# Patient Record
Sex: Female | Born: 1960
Health system: Southern US, Community
[De-identification: ages and names within clinical notes are randomized; demographics above are authoritative.]

## PROBLEM LIST (undated history)

## (undated) DIAGNOSIS — M1990 Unspecified osteoarthritis, unspecified site: Secondary | ICD-10-CM

## (undated) HISTORY — PX: CHOLECYSTECTOMY: SHX55

## (undated) HISTORY — DX: Unspecified osteoarthritis, unspecified site: M19.90

## (undated) HISTORY — PX: OTHER SURGICAL HISTORY: SHX169

## (undated) HISTORY — PX: WISDOM TOOTH EXTRACTION: SHX21

---

## 2012-04-28 ENCOUNTER — Other Ambulatory Visit: Payer: Self-pay | Admitting: Obstetrics and Gynecology

## 2012-04-28 DIAGNOSIS — Z1231 Encounter for screening mammogram for malignant neoplasm of breast: Secondary | ICD-10-CM

## 2012-05-12 ENCOUNTER — Ambulatory Visit
Admission: RE | Admit: 2012-05-12 | Discharge: 2012-05-12 | Disposition: A | Discharge status: Home / Self Care | Payer: 59 | Source: Ambulatory Visit | Attending: Obstetrics and Gynecology | Admitting: Obstetrics and Gynecology

## 2012-05-12 DIAGNOSIS — Z1231 Encounter for screening mammogram for malignant neoplasm of breast: Secondary | ICD-10-CM

## 2012-05-17 ENCOUNTER — Other Ambulatory Visit: Payer: Self-pay | Admitting: Obstetrics and Gynecology

## 2012-05-17 DIAGNOSIS — R928 Other abnormal and inconclusive findings on diagnostic imaging of breast: Secondary | ICD-10-CM

## 2012-05-19 ENCOUNTER — Ambulatory Visit
Admission: RE | Admit: 2012-05-19 | Discharge: 2012-05-19 | Disposition: A | Discharge status: Home / Self Care | Payer: 59 | Source: Ambulatory Visit | Attending: Obstetrics and Gynecology | Admitting: Obstetrics and Gynecology

## 2012-05-19 DIAGNOSIS — R928 Other abnormal and inconclusive findings on diagnostic imaging of breast: Secondary | ICD-10-CM

## 2014-02-01 ENCOUNTER — Other Ambulatory Visit: Payer: Self-pay

## 2014-02-01 DIAGNOSIS — Z1231 Encounter for screening mammogram for malignant neoplasm of breast: Secondary | ICD-10-CM

## 2014-03-08 ENCOUNTER — Encounter (INDEPENDENT_AMBULATORY_CARE_PROVIDER_SITE_OTHER): Payer: Self-pay

## 2014-03-08 ENCOUNTER — Ambulatory Visit
Admission: RE | Admit: 2014-03-08 | Discharge: 2014-03-08 | Disposition: A | Discharge status: Home / Self Care | Payer: BC Managed Care – PPO | Source: Ambulatory Visit

## 2014-03-08 DIAGNOSIS — Z1231 Encounter for screening mammogram for malignant neoplasm of breast: Secondary | ICD-10-CM

## 2014-03-11 ENCOUNTER — Other Ambulatory Visit: Payer: Self-pay | Admitting: Obstetrics and Gynecology

## 2014-03-11 DIAGNOSIS — R928 Other abnormal and inconclusive findings on diagnostic imaging of breast: Secondary | ICD-10-CM

## 2014-03-19 ENCOUNTER — Ambulatory Visit
Admission: RE | Admit: 2014-03-19 | Discharge: 2014-03-19 | Disposition: A | Discharge status: Home / Self Care | Payer: BC Managed Care – PPO | Source: Ambulatory Visit | Attending: Obstetrics and Gynecology | Admitting: Obstetrics and Gynecology

## 2014-03-19 DIAGNOSIS — R928 Other abnormal and inconclusive findings on diagnostic imaging of breast: Secondary | ICD-10-CM

## 2014-04-01 ENCOUNTER — Ambulatory Visit (INDEPENDENT_AMBULATORY_CARE_PROVIDER_SITE_OTHER): Payer: BC Managed Care – PPO | Admitting: Sports Medicine

## 2014-04-01 ENCOUNTER — Encounter: Payer: Self-pay | Admitting: Sports Medicine

## 2014-04-01 ENCOUNTER — Ambulatory Visit
Admission: RE | Admit: 2014-04-01 | Discharge: 2014-04-01 | Disposition: A | Discharge status: Home / Self Care | Payer: BC Managed Care – PPO | Source: Ambulatory Visit | Attending: Sports Medicine | Admitting: Sports Medicine

## 2014-04-01 VITALS — BP 108/80 | Ht 64.0 in | Wt 165.0 lb

## 2014-04-01 DIAGNOSIS — M1612 Unilateral primary osteoarthritis, left hip: Secondary | ICD-10-CM

## 2014-04-01 DIAGNOSIS — M25559 Pain in unspecified hip: Secondary | ICD-10-CM

## 2014-04-01 DIAGNOSIS — M25552 Pain in left hip: Secondary | ICD-10-CM

## 2014-04-01 DIAGNOSIS — M1610 Unilateral primary osteoarthritis, unspecified hip: Secondary | ICD-10-CM

## 2014-04-01 NOTE — Progress Notes (Signed)
   Subjective:    Patient ID: Helen Andrade, female    DOB: [birth year], 53 y.o.Helen Andrade   MRN: [id number]  HPI chief complaint: Left hip pain  Very pleasant 53 year old female comes in today at the request of Helen PeckLorraine Andrade for evaluation of her left hip. She has had left hip pain now for about 3 years. Pain began acutely after doing some stretches. Her pain is primarily in the left groin but at times were radiate through to the posterior hip. Her pain is not terribly uncomfortable but she does notice limited range of motion from time to time. She notices it most when she is externally rotating her hip or going to step on a very high step. She's had physical therapy both in [place] well as [place]. Dry needling seems to help her quite a bit. She is also being given several hip stretches and admits that today she feels probably as good as she has felt in quite some time. She has not had any imaging studies of her hip. No remote trauma. She does not take any pain medication for this. No associated numbness or tingling. She is leaving in 3 weeks for a trip to [place] and will be back in the middle of September.    Review of Systems    as above Objective:   Physical Exam Well-developed, well-nourished. No acute distress. Awake alert and oriented x3. Vital signs are reviewed  Left hip: Patient has smooth painless hip range of motion but has reproducible groin pain with full internal rotation and axial loading of the joint. No pain with resisted hip flexion. Good strength. No tenderness over the greater trochanteric bursa. Positive Thomas test. Slight leg length discrepancy with the right leg being about 1 cm shorter than the left. Neurovascularly intact distally. Walking without a limp.  X-rays of the left hip including an AP pelvis and lateral left hip are reviewed. There are mild degenerative changes in the left acetabulum. Otherwise unremarkable.       Assessment & Plan:    Left hip pain secondary to mild hip osteoarthritis  Patient's symptoms are definitely tolerable. I think she should continue to work on range of motion and stretching. Her symptoms are not significant enough to consider anything more aggressive at this time. If that changes we could consider an intra-articular injection. Followup prn.  Addendum: Patient left the office the day before I could fit her with a 3/16 inch heel lift for her shorter right leg. She may stop by the office at her convenience to pick this up.

## 2014-05-15 ENCOUNTER — Encounter: Payer: Self-pay | Admitting: Sports Medicine

## 2014-05-15 ENCOUNTER — Ambulatory Visit (INDEPENDENT_AMBULATORY_CARE_PROVIDER_SITE_OTHER): Payer: BC Managed Care – PPO | Admitting: Sports Medicine

## 2014-05-15 VITALS — BP 89/52 | Ht 64.0 in | Wt 165.0 lb

## 2014-05-15 DIAGNOSIS — M25552 Pain in left hip: Secondary | ICD-10-CM

## 2014-05-15 DIAGNOSIS — M1612 Unilateral primary osteoarthritis, left hip: Secondary | ICD-10-CM

## 2014-05-15 DIAGNOSIS — M1610 Unilateral primary osteoarthritis, unspecified hip: Secondary | ICD-10-CM

## 2014-05-15 DIAGNOSIS — M25559 Pain in unspecified hip: Secondary | ICD-10-CM

## 2014-05-15 NOTE — Progress Notes (Signed)
   Subjective:    Patient ID: Helen Andrade, female    DOB: Oct 18, 1960, 53 y.o.   MRN: [id number]  HPI Patient comes in today for followup on left hip pain. She still struggling with diffuse pain in both the anterior and posterior hip. She has radiating pain down into her femur first thing in the morning. Recent x-rays showed some mild degenerative changes in this left hip. Overall, her symptoms are very tolerable. She is able to remain active but does state that the pain becomes quite annoying.    Review of Systems     Objective:   Physical Exam Well-developed, no acute distress.  Left hip: Patient demonstrates good full hip range of motion but does have reproducible pain with full internal rotation. Reproducible pain with resisted hip flexion as well. Neurovascularly intact distally. Walking without a significant limp.  X-rays are as above.       Assessment & Plan:  Left hip pain likely secondary to mild DJD  For diagnostic as well as therapeutic reasons I've recommended an intra-articular cortisone injection to be done at [facility] imaging. I've asked the patient to call me about a week after the injection for a status update. I explained to her that this injection will be both diagnostic as well as hopefully therapeutic. I've also recommended that she try 1500 mg of glucosamine and chondroitin sulfate daily for 6 weeks. I think the patient would continue to benefit from occasional physical therapy to maintain range of motion and strength.

## 2014-05-15 NOTE — Patient Instructions (Signed)
Try  of glucosamine. It comes in combination with chondroitin sulfate.

## 2014-05-16 ENCOUNTER — Other Ambulatory Visit: Payer: Self-pay | Admitting: Sports Medicine

## 2014-05-16 DIAGNOSIS — M25552 Pain in left hip: Secondary | ICD-10-CM

## 2014-06-03 ENCOUNTER — Ambulatory Visit
Admission: RE | Admit: 2014-06-03 | Discharge: 2014-06-03 | Disposition: A | Discharge status: Home / Self Care | Payer: BC Managed Care – PPO | Source: Ambulatory Visit | Attending: Sports Medicine | Admitting: Sports Medicine

## 2014-06-03 DIAGNOSIS — M25552 Pain in left hip: Secondary | ICD-10-CM

## 2014-06-03 MED ORDER — METHYLPREDNISOLONE ACETATE 40 MG/ML INJ SUSP (RADIOLOG
120.0000 mg | Freq: Once | INTRAMUSCULAR | Status: AC
  Administered 2014-06-03: 120 mg via INTRA_ARTICULAR

## 2014-06-03 MED ORDER — IOHEXOL 180 MG/ML  SOLN
1.0000 mL | Freq: Once | INTRAMUSCULAR | Status: AC | PRN
  Administered 2014-06-03: 1 mL via INTRA_ARTICULAR

## 2016-03-23 ENCOUNTER — Ambulatory Visit (INDEPENDENT_AMBULATORY_CARE_PROVIDER_SITE_OTHER): Payer: BLUE CROSS/BLUE SHIELD | Admitting: Family Medicine

## 2016-03-23 ENCOUNTER — Encounter: Payer: Self-pay | Admitting: Family Medicine

## 2016-03-23 VITALS — BP 114/78 | HR 81 | Temp 98.1°F | Resp 20 | Ht 64.0 in | Wt 169.2 lb

## 2016-03-23 DIAGNOSIS — M1990 Unspecified osteoarthritis, unspecified site: Secondary | ICD-10-CM

## 2016-03-23 DIAGNOSIS — M79641 Pain in right hand: Secondary | ICD-10-CM | POA: Insufficient documentation

## 2016-03-23 DIAGNOSIS — M79642 Pain in left hand: Secondary | ICD-10-CM | POA: Diagnosis not present

## 2016-03-23 DIAGNOSIS — M25552 Pain in left hip: Secondary | ICD-10-CM | POA: Insufficient documentation

## 2016-03-23 DIAGNOSIS — M1612 Unilateral primary osteoarthritis, left hip: Secondary | ICD-10-CM | POA: Insufficient documentation

## 2016-03-23 DIAGNOSIS — Z7189 Other specified counseling: Secondary | ICD-10-CM

## 2016-03-23 DIAGNOSIS — Z7689 Persons encountering health services in other specified circumstances: Secondary | ICD-10-CM

## 2016-03-23 LAB — COMPREHENSIVE METABOLIC PANEL
ALT: 13 U/L (ref 6–29)
AST: 15 U/L (ref 10–35)
Albumin: 4.4 g/dL (ref 3.6–5.1)
Alkaline Phosphatase: 58 U/L (ref 33–130)
BUN: 12 mg/dL (ref 7–25)
CHLORIDE: 105 mmol/L (ref 98–110)
CO2: 25 mmol/L (ref 20–31)
Calcium: 10.1 mg/dL (ref 8.6–10.4)
Creat: 0.86 mg/dL (ref 0.50–1.05)
Glucose, Bld: 86 mg/dL (ref 65–99)
POTASSIUM: 4.1 mmol/L (ref 3.5–5.3)
SODIUM: 141 mmol/L (ref 135–146)
Total Bilirubin: 0.7 mg/dL (ref 0.2–1.2)
Total Protein: 7.4 g/dL (ref 6.1–8.1)

## 2016-03-23 LAB — CBC WITH DIFFERENTIAL/PLATELET
Basophils Absolute: 0 cells/uL (ref 0–200)
Basophils Relative: 0 %
EOS PCT: 1 %
Eosinophils Absolute: 70 cells/uL (ref 15–500)
HCT: 43.4 % (ref 35.0–45.0)
Hemoglobin: 14.7 g/dL (ref 11.7–15.5)
LYMPHS PCT: 25 %
Lymphs Abs: 1750 cells/uL (ref 850–3900)
MCH: 31.4 pg (ref 27.0–33.0)
MCHC: 33.9 g/dL (ref 32.0–36.0)
MCV: 92.7 fL (ref 80.0–100.0)
MONOS PCT: 9 %
MPV: 9.7 fL (ref 7.5–12.5)
Monocytes Absolute: 630 cells/uL (ref 200–950)
NEUTROS PCT: 65 %
Neutro Abs: 4550 cells/uL (ref 1500–7800)
PLATELETS: 251 10*3/uL (ref 140–400)
RBC: 4.68 MIL/uL (ref 3.80–5.10)
RDW: 14 % (ref 11.0–15.0)
WBC: 7 10*3/uL (ref 3.8–10.8)

## 2016-03-23 LAB — RHEUMATOID FACTOR: Rhuematoid fact SerPl-aCnc: 12 IU/mL (ref <=14)

## 2016-03-23 LAB — C-REACTIVE PROTEIN: CRP: <0.5 mg/dL (ref <0.60)

## 2016-03-23 NOTE — Patient Instructions (Signed)
Arthritis Arthritis is a term that is commonly used to refer to joint pain or joint disease. There are more than 100 types of arthritis. CAUSES The most common cause of this condition is wear and tear of a joint. Other causes include:  Gout.  Inflammation of a joint.  An infection of a joint.  Sprains and other injuries near the joint.  A drug reaction or allergic reaction. In some cases, the cause may not be known. SYMPTOMS The main symptom of this condition is pain in the joint with movement. Other symptoms include:  Redness, swelling, or stiffness at a joint.  Warmth coming from the joint.  Fever.  Overall feeling of illness. DIAGNOSIS This condition may be diagnosed with a physical exam and tests, including:  Blood tests.  Urine tests.  Imaging tests, such as MRI, X-rays, or a CT scan. Sometimes, fluid is removed from a joint for testing. TREATMENT Treatment for this condition may involve:  Treatment of the cause, if it is known.  Rest.  Raising (elevating) the joint.  Applying cold or hot packs to the joint.  Medicines to improve symptoms and reduce inflammation.  Injections of a steroid such as cortisone into the joint to help reduce pain and inflammation. Depending on the cause of your arthritis, you may need to make lifestyle changes to reduce stress on your joint. These changes may include exercising more and losing weight. HOME CARE INSTRUCTIONS Medicines  Take over-the-counter and prescription medicines only as told by your health care provider.  Do not take aspirin to relieve pain if gout is suspected. Activities  Rest your joint if told by your health care provider. Rest is important when your disease is active and your joint feels painful, swollen, or stiff.  Avoid activities that make the pain worse. It is important to balance activity with rest.  Exercise your joint regularly with range-of-motion exercises as told by your health care  provider. Try doing low-impact exercise, such as:  Swimming.  Water aerobics.  Biking.  Walking. Joint Care  If your joint is swollen, keep it elevated if told by your health care provider.  If your joint feels stiff in the morning, try taking a warm shower.  If directed, apply heat to the joint. If you have diabetes, do not apply heat without permission from your health care provider.  Put a towel between the joint and the hot pack or heating pad.  Leave the heat on the area for 20-30 minutes.  If directed, apply ice to the joint:  Put ice in a plastic bag.  Place a towel between your skin and the bag.  Leave the ice on for 20 minutes, 2-3 times per day.  Keep all follow-up visits as told by your health care provider. This is important. SEEK MEDICAL CARE IF:  The pain gets worse.  You have a fever. SEEK IMMEDIATE MEDICAL CARE IF:  You develop severe joint pain, swelling, or redness.  Many joints become painful and swollen.  You develop severe back pain.  You develop severe weakness in your leg.  You cannot control your bladder or bowels.   This information is not intended to replace advice given to you by your health care provider. Make sure you discuss any questions you have with your health care provider.   Document Released: 09/23/2004 Document Revised: 05/07/2015 Document Reviewed: 11/11/2014 Elsevier Interactive Patient Education [employer]  Labs today, to look for signs of inflammatory disease.  I have also sent  a referral to orthopedics, they will call you to set up appt.

## 2016-03-23 NOTE — Progress Notes (Signed)
Patient ID: Helen Andrade, female  DOB: [birth year], 55 y.o.   MRN: [id number] Patient Care Team    Relationship Specialty Notifications Start End  Ma Hillock, DO PCP - General Family Medicine  03/23/16     Subjective:  Helen Andrade is a 55 y.o.  female present for new patient establishment with complaints of chronic hip/hand pain.  All past medical history, surgical history, allergies, family history, immunizations, medications and social history were obtained and entered in the electronic medical record today. All recent labs, ED visits and hospitalizations within the last year were reviewed.  Hip and hand pain: patient states she believes her Father had a history of arthritis, but she is uncertain what type. He died unexpectedly and therefore she never knew much of his history. She has been suffering from left hip joint pain that will radiate to her anterior thigh from hip joint. She also endorses a different type of pain that she gets by her sacrum/buttocks area on the left. She has bilateral finger/joint pain that affects both proximal and distal joints of all fingers except her thumbs. 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 She endorses swelling of the hand joints on occasions. No redness. Tightness feeling. Mornings are worst. She takes occasional ASA only when needed but does not like to take medicines daily. She has seen a sports med doctor about 2 years ago for issue and received a steroid injection in the hip that did resolve her issues for a few months. Her xray 2015 resulted with supraacetabular arthritis.   Health maintenance:  Colonoscopy: No Fhx. Overdue for screen.  Mammogram: completed:2015, birads 2. Breast cyst by [place] Cervical cancer screening: last pap: unknown Immunizations: tdap 2009, influenza 2016. Infectious disease screening: unknown.     There is no immunization history on file for  this patient.   Past Medical History:  Diagnosis Date  . Arthritis    No Known Allergies Past Surgical History:  Procedure Laterality Date  . tooth implant    . WISDOM TOOTH EXTRACTION     Family History  Problem Relation Age of Onset  . Hearing loss Mother   . Early death Father   . Arthritis Father   . Hearing loss Maternal Aunt    Social History   Social History  . Marital status: Significant Other    Spouse name: N/A  . Number of children: N/A  . Years of education: N/A   Occupational History  . Not on file.   Social History Main Topics  . Smoking status: Never Smoker  . Smokeless tobacco: Never Used  . Alcohol use 1.8 oz/week    3 Glasses of wine per week  . Drug use: No  . Sexual activity: Yes   Other Topics Concern  . Not on file   Social History Narrative   Married, female partner Helen Andrade (**) [place]. 1 daughter Helen Andrade (adult).    Post Grad. Interdisciplinary Artist.    No tobacco, drugs. Social alcohol.    Drinks caffeine.    Wears seatbelt, bicycle helmet and smoke detector in the home.    Feels safe in her relationships.      Medication List    as of 03/23/2016  4:38 PM   You have not been prescribed any medications.      No results found for this or any previous visit (from the past 2160 hour(s)).  Dg Fluoro Guide Ndl Plc/bx  Result Date: 06/03/2014 CLINICAL DATA:  Left hip pain EXAM: LEFT HIP INJECTION UNDER FLUOROSCOPY FLUOROSCOPY TIME:  21 seconds. PROCEDURE: Overlying skin prepped with Betadine, draped in the usual sterile fashion, and infiltrated locally with buffered Lidocaine. Curved 22 gauge spinal needle advanced to the superolateral margin of the left femoral head. 1 ml of Lidocaine injected easily. Diagnostic injection of iodinated contrastdemonstrates intra-articular spread without intravascular component. 125m Depo-Medrol and 5 cc 1% lidocaine were then administered. No immediate complication. IMPRESSION: Technically successful  left hip injection under fluoroscopy. Electronically Signed   By: AMaryclare BeanM.D.   On: 06/03/2014 14:44    ROS: 14 pt review of systems performed and negative (unless mentioned in an HPI)  Objective: BP 114/78 (BP Location: Right Arm, Patient Position: Sitting, Cuff Size: Normal)   Pulse 81   Temp 98.1 F (36.7 C) (Oral)   Resp 20   Ht 5' 4" (1.626 m)   Wt 169 lb 4 oz (76.8 kg)   LMP 03/13/2016 (Exact Date)   SpO2 98%   BMI 29.05 kg/m  Gen: Afebrile. No acute distress. Nontoxic in appearance, well-developed, well-nourished,  Pleasant, s[place] female.  HENT: AT. [place]. MMM, no oral lesions Eyes:Pupils Equal Round Reactive to light, Extraocular movements intact,  Conjunctiva without redness, discharge or icterus. Neck/lymp/endocrine: Supple,no lymphadenopathy CV: RRR, no edema, +2/4 P posterior tibialis pulses.  Chest: CTAB, no wheeze, rhonchi or crackles.  Abd: Soft. NTND. BS present.  MSK;     Hands: bilateral hands without erythema, joint swelling, deformities. FROM. NV intact,     Hip: left hip without erythema, swelling. Decreased ROM in flexion, internal rotation and discomfort with ext rotation in comparison to right. + FABRE. Neg SLR. Bilateral knee with crepitus, full ROM.  Skin: no rashes, purpura or petechiae. Warm and well-perfused. Skin intact. Neuro/Msk: Normal gait. PERLA. EOMi. Alert. Oriented x3.   Psych: Normal affect, dress and demeanor. Normal speech. Normal thought content and judgment.   Assessment/plan: [name] a 55y.o. female present for establishment of care with arthritis  Complaints. Left hip pain/Bilateral hand pain - CBC w/Diff - Comprehensive metabolic panel - ANA - Sed Rate (ESR) - C-reactive protein - Rheumatoid Factor - Antistreptolysin O titer - discussed rheumatology vs daily NSAID depending on results.   Arthritis of left hip/pain - defer imaging to orthopedics. Degenerative changes on 2015 xray, with worsening symptoms.  -  discussed daily nsaid therapy. Pt would like to not have to take medicine daily if possible.  - Ambulatory referral to Orthopedic Surgery      Return in about 4 weeks (around 04/20/2016).  Will need CPE within 3 months.   Electronically signed by: RHoward Pouch DO [place]

## 2016-03-24 LAB — ANA: Anti Nuclear Antibody(ANA): NEGATIVE

## 2016-03-24 LAB — SEDIMENTATION RATE: SED RATE: 1 mm/h (ref 0–30)

## 2016-03-25 ENCOUNTER — Telehealth: Payer: Self-pay | Admitting: Family Medicine

## 2016-03-25 DIAGNOSIS — M19041 Primary osteoarthritis, right hand: Secondary | ICD-10-CM | POA: Insufficient documentation

## 2016-03-25 DIAGNOSIS — M19042 Primary osteoarthritis, left hand: Principal | ICD-10-CM

## 2016-03-25 LAB — ANTISTREPTOLYSIN O TITER: ASO: 86 [IU]/mL (ref <=200)

## 2016-03-25 MED ORDER — MELOXICAM 15 MG PO TABS: 15.0000 mg | ORAL_TABLET | Freq: Every day | ORAL | 0 refills | Status: DC

## 2016-03-25 NOTE — Telephone Encounter (Signed)
Spoke with patient reviewed lab results and instructions. Patient verbalized understanding. 

## 2016-03-25 NOTE — Telephone Encounter (Signed)
Please call pt: - all her labs are normal. There  Does not appear to be an autoimmune disorder or Inflammatory/rheumatoid cause for her arthritis/hand pain.  - I would recommend antiinflammatory, such has meloxicam to help with arthritis pain, if she is able to tolerate it. It should be taken with meals to help avoid GI upset.

## 2016-04-09 DIAGNOSIS — M1612 Unilateral primary osteoarthritis, left hip: Secondary | ICD-10-CM | POA: Diagnosis not present

## 2016-04-29 DIAGNOSIS — M1612 Unilateral primary osteoarthritis, left hip: Secondary | ICD-10-CM | POA: Diagnosis not present

## 2016-06-07 ENCOUNTER — Other Ambulatory Visit: Payer: Self-pay | Admitting: *Deleted

## 2016-06-07 DIAGNOSIS — M19042 Primary osteoarthritis, left hand: Principal | ICD-10-CM

## 2016-06-07 DIAGNOSIS — M19041 Primary osteoarthritis, right hand: Secondary | ICD-10-CM

## 2016-06-07 NOTE — Telephone Encounter (Signed)
Request for meloxicam denied patient needs appt. Left message for patient to call and schedule.

## 2016-10-14 DIAGNOSIS — M1612 Unilateral primary osteoarthritis, left hip: Secondary | ICD-10-CM | POA: Diagnosis not present

## 2017-06-01 DIAGNOSIS — Z01419 Encounter for gynecological examination (general) (routine) without abnormal findings: Secondary | ICD-10-CM | POA: Diagnosis not present

## 2017-06-01 DIAGNOSIS — Z1151 Encounter for screening for human papillomavirus (HPV): Secondary | ICD-10-CM | POA: Diagnosis not present

## 2017-06-01 DIAGNOSIS — Z6824 Body mass index (BMI) 24.0-24.9, adult: Secondary | ICD-10-CM | POA: Diagnosis not present

## 2017-06-01 DIAGNOSIS — Z1231 Encounter for screening mammogram for malignant neoplasm of breast: Secondary | ICD-10-CM | POA: Diagnosis not present

## 2017-06-01 LAB — HM PAP SMEAR: HM PAP: NORMAL

## 2017-06-01 LAB — HM MAMMOGRAPHY

## 2017-07-13 ENCOUNTER — Encounter: Payer: Self-pay | Admitting: Family Medicine

## 2017-07-15 ENCOUNTER — Encounter: Payer: Self-pay | Admitting: *Deleted

## 2017-07-18 ENCOUNTER — Encounter: Payer: BLUE CROSS/BLUE SHIELD | Admitting: Family Medicine

## 2017-10-28 DIAGNOSIS — H40053 Ocular hypertension, bilateral: Secondary | ICD-10-CM | POA: Diagnosis not present

## 2017-11-01 ENCOUNTER — Other Ambulatory Visit: Payer: Self-pay | Admitting: Gastroenterology

## 2017-11-01 DIAGNOSIS — R1013 Epigastric pain: Secondary | ICD-10-CM | POA: Diagnosis not present

## 2017-11-01 DIAGNOSIS — R112 Nausea with vomiting, unspecified: Secondary | ICD-10-CM

## 2017-11-01 DIAGNOSIS — Z1211 Encounter for screening for malignant neoplasm of colon: Secondary | ICD-10-CM | POA: Diagnosis not present

## 2017-11-01 DIAGNOSIS — R1011 Right upper quadrant pain: Secondary | ICD-10-CM

## 2017-11-02 LAB — TSH: TSH: 1.8 (ref 0.41–5.90)

## 2017-11-02 LAB — BASIC METABOLIC PANEL
BUN: 10 (ref 4–21)
Creatinine: 0.9 (ref 0.5–1.1)
Glucose: 93
POTASSIUM: 4.8 (ref 3.4–5.3)
Sodium: 141 (ref 137–147)

## 2017-11-02 LAB — CBC AND DIFFERENTIAL
HCT: 46 (ref 36–46)
HEMOGLOBIN: 15.6 (ref 12.0–16.0)
Platelets: 249 (ref 150–399)
WBC: 5.1

## 2017-11-02 LAB — HEPATIC FUNCTION PANEL
ALT: 17 (ref 7–35)
AST: 19 (ref 13–35)
Alkaline Phosphatase: 70 (ref 25–125)
Bilirubin, Direct: 0.11 (ref 0.01–0.4)
Bilirubin, Total: 0.4

## 2017-11-03 ENCOUNTER — Encounter: Payer: Self-pay | Admitting: Family Medicine

## 2017-11-22 ENCOUNTER — Ambulatory Visit (HOSPITAL_COMMUNITY)
Admission: RE | Admit: 2017-11-22 | Discharge: 2017-11-22 | Disposition: A | Discharge status: Home / Self Care | Payer: BLUE CROSS/BLUE SHIELD | Source: Ambulatory Visit | Attending: Gastroenterology | Admitting: Gastroenterology

## 2017-11-22 ENCOUNTER — Encounter (HOSPITAL_COMMUNITY): Admission: RE | Admit: 2017-11-22 | Payer: BLUE CROSS/BLUE SHIELD | Source: Ambulatory Visit

## 2017-11-22 DIAGNOSIS — K8020 Calculus of gallbladder without cholecystitis without obstruction: Secondary | ICD-10-CM | POA: Diagnosis not present

## 2017-11-22 DIAGNOSIS — R1011 Right upper quadrant pain: Secondary | ICD-10-CM

## 2017-11-22 DIAGNOSIS — R112 Nausea with vomiting, unspecified: Secondary | ICD-10-CM

## 2017-12-14 ENCOUNTER — Encounter: Payer: Self-pay | Admitting: Family Medicine

## 2017-12-14 DIAGNOSIS — D125 Benign neoplasm of sigmoid colon: Secondary | ICD-10-CM | POA: Diagnosis not present

## 2017-12-14 DIAGNOSIS — Z1211 Encounter for screening for malignant neoplasm of colon: Secondary | ICD-10-CM | POA: Diagnosis not present

## 2017-12-14 DIAGNOSIS — K635 Polyp of colon: Secondary | ICD-10-CM | POA: Diagnosis not present

## 2017-12-14 LAB — HM COLONOSCOPY

## 2017-12-20 ENCOUNTER — Other Ambulatory Visit: Payer: Self-pay | Admitting: Surgery

## 2017-12-20 ENCOUNTER — Encounter: Payer: Self-pay | Admitting: Family Medicine

## 2017-12-20 DIAGNOSIS — K8020 Calculus of gallbladder without cholecystitis without obstruction: Secondary | ICD-10-CM | POA: Diagnosis not present

## 2017-12-29 DIAGNOSIS — K8010 Calculus of gallbladder with chronic cholecystitis without obstruction: Secondary | ICD-10-CM | POA: Diagnosis not present

## 2018-01-19 ENCOUNTER — Ambulatory Visit (HOSPITAL_COMMUNITY): Admission: RE | Admit: 2018-01-19 | Payer: BLUE CROSS/BLUE SHIELD | Source: Ambulatory Visit | Admitting: Surgery

## 2018-01-19 ENCOUNTER — Encounter (HOSPITAL_COMMUNITY): Admission: RE | Payer: Self-pay | Source: Ambulatory Visit

## 2018-01-19 SURGERY — LAPAROSCOPIC CHOLECYSTECTOMY
Anesthesia: General

## 2018-03-27 ENCOUNTER — Encounter: Payer: Self-pay | Admitting: Family Medicine

## 2018-03-27 ENCOUNTER — Ambulatory Visit: Payer: BLUE CROSS/BLUE SHIELD | Admitting: Family Medicine

## 2018-03-27 VITALS — BP 109/78 | HR 88 | Temp 98.0°F | Resp 20 | Ht 64.0 in | Wt 150.0 lb

## 2018-03-27 DIAGNOSIS — R42 Dizziness and giddiness: Secondary | ICD-10-CM | POA: Diagnosis not present

## 2018-03-27 MED ORDER — PREDNISONE 20 MG PO TABS: ORAL_TABLET | ORAL | 0 refills | Status: DC

## 2018-03-27 MED ORDER — MECLIZINE HCL 25 MG PO TABS: 25.0000 mg | ORAL_TABLET | Freq: Three times a day (TID) | ORAL | 0 refills | Status: DC | PRN

## 2018-03-27 MED ORDER — FLUTICASONE PROPIONATE 50 MCG/ACT NA SUSP: 1.0000 | Freq: Every day | NASAL | 2 refills | Status: DC

## 2018-03-27 NOTE — Patient Instructions (Signed)
Start meclizine (antivert) up to every 8 hours as needed. May cause drowsiness.  Prednisone taper daily as prescribed.  flonase nasal spray daily.  HYDRATE!!!! > 80 ounces a week. More if exercising or sweating.   You can try the epley maneuver at home as well.    These seems consistent with BPPV. If not improved or worsening over next 2 weeks follow up.    Benign Positional Vertigo Vertigo is the feeling that you or your surroundings are moving when they are not. Benign positional vertigo is the most common form of vertigo. The cause of this condition is not serious (is benign). This condition is triggered by certain movements and positions (is positional). This condition can be dangerous if it occurs while you are doing something that could endanger you or others, such as driving. What are the causes? In many cases, the cause of this condition is not known. It may be caused by a disturbance in an area of the inner ear that helps your brain to sense movement and balance. This disturbance can be caused by a viral infection (labyrinthitis), head injury, or repetitive motion. What increases the risk? This condition is more likely to develop in:  Women.  People who are 55 years of age or older.  What are the signs or symptoms? Symptoms of this condition usually happen when you move your head or your eyes in different directions. Symptoms may start suddenly, and they usually last for less than a minute. Symptoms may include:  Loss of balance and falling.  Feeling like you are spinning or moving.  Feeling like your surroundings are spinning or moving.  Nausea and vomiting.  Blurred vision.  Dizziness.  Involuntary eye movement (nystagmus).  Symptoms can be mild and cause only slight annoyance, or they can be severe and interfere with daily life. Episodes of benign positional vertigo may return (recur) over time, and they may be triggered by certain movements. Symptoms may improve  over time. How is this diagnosed? This condition is usually diagnosed by medical history and a physical exam of the head, neck, and ears. You may be referred to a health care provider who specializes in ear, nose, and throat (ENT) problems (otolaryngologist) or a provider who specializes in disorders of the nervous system (neurologist). You may have additional testing, including:  MRI.  A CT scan.  Eye movement tests. Your health care provider may ask you to change positions quickly while he or she watches you for symptoms of benign positional vertigo, such as nystagmus. Eye movement may be tested with an electronystagmogram (ENG), caloric stimulation, the Dix-Hallpike test, or the roll test.  An electroencephalogram (EEG). This records electrical activity in your brain.  Hearing tests.  How is this treated? Usually, your health care provider will treat this by moving your head in specific positions to adjust your inner ear back to normal. Surgery may be needed in severe cases, but this is rare. In some cases, benign positional vertigo may resolve on its own in 2-4 weeks. Follow these instructions at home: Safety  Move slowly.Avoid sudden body or head movements.  Avoid driving.  Avoid operating heavy machinery.  Avoid doing any tasks that would be dangerous to you or others if a vertigo episode would occur.  If you have trouble walking or keeping your balance, try using a cane for stability. If you feel dizzy or unstable, sit down right away.  Return to your normal activities as told by your health care provider. Ask  your health care provider what activities are safe for you. General instructions  Take over-the-counter and prescription medicines only as told by your health care provider.  Avoid certain positions or movements as told by your health care provider.  Drink enough fluid to keep your urine clear or pale yellow.  Keep all follow-up visits as told by your health care  provider. This is important. Contact a health care provider if:  You have a fever.  Your condition gets worse or you develop new symptoms.  Your family or friends notice any behavioral changes.  Your nausea or vomiting gets worse.  You have numbness or a "pins and needles" sensation. Get help right away if:  You have difficulty speaking or moving.  You are always dizzy.  You faint.  You develop severe headaches.  You have weakness in your legs or arms.  You have changes in your hearing or vision.  You develop a stiff neck.  You develop sensitivity to light. This information is not intended to replace advice given to you by your health care provider. Make sure you discuss any questions you have with your health care provider. Document Released: 05/24/2006 Document Revised: 01/22/2016 Document Reviewed: 12/09/2014 Elsevier Interactive Patient Education  2018 [employer]  How to Perform the Epley Maneuver The Epley maneuver is an exercise that relieves symptoms of vertigo. Vertigo is the feeling that you or your surroundings are moving when they are not. When you feel vertigo, you may feel like the room is spinning and have trouble walking. Dizziness is a little different than vertigo. When you are dizzy, you may feel unsteady or light-headed. You can do this maneuver at home whenever you have symptoms of vertigo. You can do it up to 3 times a day until your symptoms go away. Even though the Epley maneuver may relieve your vertigo for a few weeks, it is possible that your symptoms will return. This maneuver relieves vertigo, but it does not relieve dizziness. What are the risks? If it is done correctly, the Epley maneuver is considered safe. Sometimes it can lead to dizziness or nausea that goes away after a short time. If you develop other symptoms, such as changes in vision, weakness, or numbness, stop doing the maneuver and call your health care provider. How to perform the  Epley maneuver 1. Sit on the edge of a bed or table with your back straight and your legs extended or hanging over the edge of the bed or table. 2. Turn your head halfway toward the affected ear or side. 3. Lie backward quickly with your head turned until you are lying flat on your back. You may want to position a pillow under your shoulders. 4. Hold this position for 30 seconds. You may experience an attack of vertigo. This is normal. 5. Turn your head to the opposite direction until your unaffected ear is facing the floor. 6. Hold this position for 30 seconds. You may experience an attack of vertigo. This is normal. Hold this position until the vertigo stops. 7. Turn your whole body to the same side as your head. Hold for another 30 seconds. 8. Sit back up. You can repeat this exercise up to 3 times a day. Follow these instructions at home:  After doing the Epley maneuver, you can return to your normal activities.  Ask your health care provider if there is anything you should do at home to prevent vertigo. He or she may recommend that you: ? Keep your  head raised (elevated) with two or more pillows while you sleep. ? Do not sleep on the side of your affected ear. ? Get up slowly from bed. ? Avoid sudden movements during the day. ? Avoid extreme head movement, like looking up or bending over. Contact a health care provider if:  Your vertigo gets worse.  You have other symptoms, including: ? Nausea. ? Vomiting. ? Headache. Get help right away if:  You have vision changes.  You have a severe or worsening headache or neck pain.  You cannot stop vomiting.  You have new numbness or weakness in any part of your body. Summary  Vertigo is the feeling that you or your surroundings are moving when they are not.  The Epley maneuver is an exercise that relieves symptoms of vertigo.  If the Epley maneuver is done correctly, it is considered safe. You can do it up to 3 times a  day. This information is not intended to replace advice given to you by your health care provider. Make sure you discuss any questions you have with your health care provider. Document Released: 08/21/2013 Document Revised: 07/06/2016 Document Reviewed: 07/06/2016 Elsevier Interactive Patient Education  2017 [employer].

## 2018-03-27 NOTE — Progress Notes (Signed)
Helen Andrade , [birth year], 57 y.o., female MRN: [id number] Patient Care Team    Relationship Specialty Notifications Start End  Ma Hillock, DO PCP - General Family Medicine  03/23/16     Chief Complaint  Patient presents with  . Dizziness    vomiting     Subjective: Pt presents for an OV with complaints of dizziness of 3 days duration.  Associated symptoms include nausea and vomit today. She states she had a similar episode about 9 years ago, but thi sis much worse. At that time it was felt to be an inner ear issue. She denies recent URI, tinnitus or blurred vision. She reports movement of her head seems to make it worse. She denies dysuria. She took a friends dramamine and reports it helped. She had been working in her garden the last few days and does not drink much water. She also was using weed killer. Depression screen PHQ 2/9 05/15/2014  Decreased Interest 0  Down, Depressed, Hopeless 0  PHQ - 2 Score 0    No Known Allergies Social History   Tobacco Use  . Smoking status: Never Smoker  . Smokeless tobacco: Never Used  Substance Use Topics  . Alcohol use: Yes    Alcohol/week: 1.8 oz    Types: 3 Glasses of wine per week   Past Medical History:  Diagnosis Date  . Arthritis    Past Surgical History:  Procedure Laterality Date  . CHOLECYSTECTOMY    . tooth implant    . WISDOM TOOTH EXTRACTION     Family History  Problem Relation Age of Onset  . Hearing loss Mother   . Early death Father   . Arthritis Father   . Hearing loss Maternal Aunt    Allergies as of 03/27/2018   No Known Allergies     Medication List    as of 03/27/2018  3:37 PM   You have not been prescribed any medications.     All past medical history, surgical history, allergies, family history, immunizations andmedications were updated in the EMR today and reviewed under the history and medication portions of their EMR.     ROS: Negative, with the exception of above mentioned in  HPI   Objective:  BP 109/78 (BP Location: Right Arm, Cuff Size: Normal)   Pulse 88   Temp 98 F (36.7 C)   Resp 20   Ht _0  (1.626 m)   Wt 150 lb (68 kg)   LMP 03/13/2016 (Exact Date)   SpO2 99%   BMI 25.75 kg/m  Body mass index is 25.75 kg/m. Gen: Afebrile. No acute distress. Nontoxic in appearance, well developed, well nourished.  HENT: AT. [place]. Bilateral TM visualized w/out erythema or buldging membranes. MMM, no oral lesions. Bilateral nares without erythema or drainage. Throat without erythema or exudates. No cough or hoarseness Eyes:Pupils Equal Round Reactive to light, Extraocular movements intact,  Conjunctiva without redness, discharge or icterus. Neck/lymp/endocrine: Supple,no lymphadenopathy CV: RRR no murmur, no edema Chest: CTAB, no wheeze or crackles. Good air movement, normal resp effort.  Skin: no rashes, purpura or petechiae.  Neuro:  Normal gait. PERLA. EOMi. Alert. Oriented x3 Cranial nerves II through XII intact. Muscle strength 5/5 bilateral u/l extremity. DTRs equal bilaterally. Dix hallpike + LEFT w/ horizontal nystagmus.  Psych: Normal affect, dress and demeanor. Normal speech. Normal thought content and judgment.  No exam data present No results found. No results found for this or any previous visit (from the past  24 hour(s)).  Assessment/Plan: Helen Andrade is a 57 y.o. female present for OV for  Dizziness/vertigo - BPPV consistent with HPI and exam. reproducible with dix hallpike.  - hydrate > 80 ounces of water a day.  - antivert, flonase and steroid burst provided. Instructions provided. Epley maneuver discussed.  Labs to rule out anemia or electrolyte cause.  - CBC - Comp Met (CMET) - F/U 2 weeks if not improved.    Reviewed expectations re: course of current medical issues.  Discussed self-management of symptoms.  Outlined signs and symptoms indicating need for more acute intervention.  Patient verbalized understanding and all  questions were answered.  Patient received an After-Visit Summary.    No orders of the defined types were placed in this encounter.    Note is dictated utilizing voice recognition software. Although note has been proof read prior to signing, occasional typographical errors still can be missed. If any questions arise, please do not hesitate to call for verification.   electronically signed by:  Howard Pouch, DO  [place]

## 2018-03-28 ENCOUNTER — Encounter: Payer: Self-pay | Admitting: Family Medicine

## 2018-03-28 LAB — COMPREHENSIVE METABOLIC PANEL
ALBUMIN: 4.7 g/dL (ref 3.5–5.2)
ALT: 12 U/L (ref 0–35)
AST: 13 U/L (ref 0–37)
Alkaline Phosphatase: 55 U/L (ref 39–117)
BUN: 10 mg/dL (ref 6–23)
CALCIUM: 10 mg/dL (ref 8.4–10.5)
CHLORIDE: 104 meq/L (ref 96–112)
CO2: 28 mEq/L (ref 19–32)
CREATININE: 0.73 mg/dL (ref 0.40–1.20)
GFR: 87.83 mL/min (ref >60.00)
Glucose, Bld: 108 mg/dL — ABNORMAL HIGH (ref 70–99)
POTASSIUM: 4.5 meq/L (ref 3.5–5.1)
Sodium: 141 mEq/L (ref 135–145)
Total Bilirubin: 0.7 mg/dL (ref 0.2–1.2)
Total Protein: 7.5 g/dL (ref 6.0–8.3)

## 2018-03-28 LAB — CBC
HCT: 43.6 % (ref 36.0–46.0)
HEMOGLOBIN: 15 g/dL (ref 12.0–15.0)
MCHC: 34.3 g/dL (ref 30.0–36.0)
MCV: 92.7 fl (ref 78.0–100.0)
PLATELETS: 199 10*3/uL (ref 150.0–400.0)
RBC: 4.7 Mil/uL (ref 3.87–5.11)
RDW: 13.4 % (ref 11.5–15.5)
WBC: 6.5 10*3/uL (ref 4.0–10.5)

## 2018-03-29 ENCOUNTER — Telehealth: Payer: Self-pay

## 2018-03-29 NOTE — Telephone Encounter (Signed)
Patient was called regarding test results and she had sent question : I have a question about COMPREHENSIVE METABOLIC PANEL resulted on 03/28/18 Should I be concerned about the Glucose BLD?  FYI I am feeling much better since my consult.   Patient informed that with her being sick and being non fasting (drinking fluids) that this was normal. Patient verbalized understanding.

## 2018-06-28 DIAGNOSIS — M5137 Other intervertebral disc degeneration, lumbosacral region: Secondary | ICD-10-CM | POA: Diagnosis not present

## 2018-06-28 DIAGNOSIS — M5136 Other intervertebral disc degeneration, lumbar region: Secondary | ICD-10-CM | POA: Diagnosis not present

## 2018-06-28 DIAGNOSIS — M9904 Segmental and somatic dysfunction of sacral region: Secondary | ICD-10-CM | POA: Diagnosis not present

## 2018-06-28 DIAGNOSIS — M9903 Segmental and somatic dysfunction of lumbar region: Secondary | ICD-10-CM | POA: Diagnosis not present

## 2018-06-29 DIAGNOSIS — M5137 Other intervertebral disc degeneration, lumbosacral region: Secondary | ICD-10-CM | POA: Diagnosis not present

## 2018-06-29 DIAGNOSIS — M5136 Other intervertebral disc degeneration, lumbar region: Secondary | ICD-10-CM | POA: Diagnosis not present

## 2018-06-29 DIAGNOSIS — M9903 Segmental and somatic dysfunction of lumbar region: Secondary | ICD-10-CM | POA: Diagnosis not present

## 2018-06-29 DIAGNOSIS — M9904 Segmental and somatic dysfunction of sacral region: Secondary | ICD-10-CM | POA: Diagnosis not present

## 2018-07-03 DIAGNOSIS — M9903 Segmental and somatic dysfunction of lumbar region: Secondary | ICD-10-CM | POA: Diagnosis not present

## 2018-07-03 DIAGNOSIS — M5137 Other intervertebral disc degeneration, lumbosacral region: Secondary | ICD-10-CM | POA: Diagnosis not present

## 2018-07-03 DIAGNOSIS — M9904 Segmental and somatic dysfunction of sacral region: Secondary | ICD-10-CM | POA: Diagnosis not present

## 2018-07-03 DIAGNOSIS — M5136 Other intervertebral disc degeneration, lumbar region: Secondary | ICD-10-CM | POA: Diagnosis not present

## 2018-07-05 DIAGNOSIS — M5136 Other intervertebral disc degeneration, lumbar region: Secondary | ICD-10-CM | POA: Diagnosis not present

## 2018-07-05 DIAGNOSIS — M9904 Segmental and somatic dysfunction of sacral region: Secondary | ICD-10-CM | POA: Diagnosis not present

## 2018-07-05 DIAGNOSIS — M9903 Segmental and somatic dysfunction of lumbar region: Secondary | ICD-10-CM | POA: Diagnosis not present

## 2018-07-05 DIAGNOSIS — M5137 Other intervertebral disc degeneration, lumbosacral region: Secondary | ICD-10-CM | POA: Diagnosis not present

## 2018-07-10 DIAGNOSIS — M9903 Segmental and somatic dysfunction of lumbar region: Secondary | ICD-10-CM | POA: Diagnosis not present

## 2018-07-10 DIAGNOSIS — M5137 Other intervertebral disc degeneration, lumbosacral region: Secondary | ICD-10-CM | POA: Diagnosis not present

## 2018-07-10 DIAGNOSIS — M9904 Segmental and somatic dysfunction of sacral region: Secondary | ICD-10-CM | POA: Diagnosis not present

## 2018-07-10 DIAGNOSIS — M5136 Other intervertebral disc degeneration, lumbar region: Secondary | ICD-10-CM | POA: Diagnosis not present

## 2018-07-12 DIAGNOSIS — M9904 Segmental and somatic dysfunction of sacral region: Secondary | ICD-10-CM | POA: Diagnosis not present

## 2018-07-12 DIAGNOSIS — M5137 Other intervertebral disc degeneration, lumbosacral region: Secondary | ICD-10-CM | POA: Diagnosis not present

## 2018-07-12 DIAGNOSIS — M5136 Other intervertebral disc degeneration, lumbar region: Secondary | ICD-10-CM | POA: Diagnosis not present

## 2018-07-12 DIAGNOSIS — M9903 Segmental and somatic dysfunction of lumbar region: Secondary | ICD-10-CM | POA: Diagnosis not present

## 2018-07-17 DIAGNOSIS — M5136 Other intervertebral disc degeneration, lumbar region: Secondary | ICD-10-CM | POA: Diagnosis not present

## 2018-07-17 DIAGNOSIS — M9903 Segmental and somatic dysfunction of lumbar region: Secondary | ICD-10-CM | POA: Diagnosis not present

## 2018-07-17 DIAGNOSIS — M5137 Other intervertebral disc degeneration, lumbosacral region: Secondary | ICD-10-CM | POA: Diagnosis not present

## 2018-07-17 DIAGNOSIS — M9904 Segmental and somatic dysfunction of sacral region: Secondary | ICD-10-CM | POA: Diagnosis not present

## 2018-07-19 DIAGNOSIS — M9903 Segmental and somatic dysfunction of lumbar region: Secondary | ICD-10-CM | POA: Diagnosis not present

## 2018-07-19 DIAGNOSIS — M5136 Other intervertebral disc degeneration, lumbar region: Secondary | ICD-10-CM | POA: Diagnosis not present

## 2018-07-19 DIAGNOSIS — M5137 Other intervertebral disc degeneration, lumbosacral region: Secondary | ICD-10-CM | POA: Diagnosis not present

## 2018-07-19 DIAGNOSIS — M9904 Segmental and somatic dysfunction of sacral region: Secondary | ICD-10-CM | POA: Diagnosis not present

## 2018-07-24 DIAGNOSIS — M9904 Segmental and somatic dysfunction of sacral region: Secondary | ICD-10-CM | POA: Diagnosis not present

## 2018-07-24 DIAGNOSIS — M9903 Segmental and somatic dysfunction of lumbar region: Secondary | ICD-10-CM | POA: Diagnosis not present

## 2018-07-24 DIAGNOSIS — M5136 Other intervertebral disc degeneration, lumbar region: Secondary | ICD-10-CM | POA: Diagnosis not present

## 2018-07-24 DIAGNOSIS — M5137 Other intervertebral disc degeneration, lumbosacral region: Secondary | ICD-10-CM | POA: Diagnosis not present

## 2018-07-26 DIAGNOSIS — M5137 Other intervertebral disc degeneration, lumbosacral region: Secondary | ICD-10-CM | POA: Diagnosis not present

## 2018-07-26 DIAGNOSIS — M9903 Segmental and somatic dysfunction of lumbar region: Secondary | ICD-10-CM | POA: Diagnosis not present

## 2018-07-26 DIAGNOSIS — M5136 Other intervertebral disc degeneration, lumbar region: Secondary | ICD-10-CM | POA: Diagnosis not present

## 2018-07-26 DIAGNOSIS — M9904 Segmental and somatic dysfunction of sacral region: Secondary | ICD-10-CM | POA: Diagnosis not present

## 2018-07-26 DIAGNOSIS — Z23 Encounter for immunization: Secondary | ICD-10-CM | POA: Diagnosis not present

## 2018-08-09 DIAGNOSIS — M9903 Segmental and somatic dysfunction of lumbar region: Secondary | ICD-10-CM | POA: Diagnosis not present

## 2018-08-09 DIAGNOSIS — M5137 Other intervertebral disc degeneration, lumbosacral region: Secondary | ICD-10-CM | POA: Diagnosis not present

## 2018-08-09 DIAGNOSIS — M5136 Other intervertebral disc degeneration, lumbar region: Secondary | ICD-10-CM | POA: Diagnosis not present

## 2018-08-09 DIAGNOSIS — M9904 Segmental and somatic dysfunction of sacral region: Secondary | ICD-10-CM | POA: Diagnosis not present

## 2019-03-13 DIAGNOSIS — Z7189 Other specified counseling: Secondary | ICD-10-CM | POA: Diagnosis not present

## 2019-03-13 DIAGNOSIS — Z03818 Encounter for observation for suspected exposure to other biological agents ruled out: Secondary | ICD-10-CM | POA: Diagnosis not present

## 2019-03-25 DIAGNOSIS — Z03818 Encounter for observation for suspected exposure to other biological agents ruled out: Secondary | ICD-10-CM | POA: Diagnosis not present

## 2019-07-12 DIAGNOSIS — Z6825 Body mass index (BMI) 25.0-25.9, adult: Secondary | ICD-10-CM | POA: Diagnosis not present

## 2019-07-12 DIAGNOSIS — Z01419 Encounter for gynecological examination (general) (routine) without abnormal findings: Secondary | ICD-10-CM | POA: Diagnosis not present

## 2019-07-12 DIAGNOSIS — Z1231 Encounter for screening mammogram for malignant neoplasm of breast: Secondary | ICD-10-CM | POA: Diagnosis not present

## 2019-11-15 ENCOUNTER — Ambulatory Visit: Payer: BC Managed Care – PPO | Attending: Internal Medicine

## 2019-11-15 DIAGNOSIS — Z23 Encounter for immunization: Secondary | ICD-10-CM

## 2019-11-15 NOTE — Progress Notes (Signed)
   Covid-19 Vaccination Clinic  Name:  Helen Andrade    MRN: [id number] DOB: 09-09-1960  11/15/2019  [name] was observed post Covid-19 immunization for 15 minutes without incident. She was provided with Vaccine Information Sheet and instruction to access the V-Safe system.   [name] was instructed to call 911 with any severe reactions post vaccine: Marland Kitchen Difficulty breathing  . Swelling of face and throat  . A fast heartbeat  . A bad rash all over body  . Dizziness and weakness   Immunizations Administered    Name Date Dose VIS Date Route   Pfizer COVID-19 Vaccine 11/15/2019  2:19 PM 0.3 mL 08/10/2019 Intramuscular   Manufacturer: [employer], [employer]   Lot: [id number]   NDC: [address]-5

## 2019-12-11 ENCOUNTER — Ambulatory Visit: Payer: BC Managed Care – PPO | Attending: Internal Medicine

## 2019-12-11 DIAGNOSIS — Z23 Encounter for immunization: Secondary | ICD-10-CM

## 2019-12-11 NOTE — Progress Notes (Signed)
   Covid-19 Vaccination Clinic  Name:  Helen Andrade    MRN: [id number] DOB: 09-10-1960  12/11/2019  Helen Andrade was observed post Covid-19 immunization for 15 minutes without incident. She was provided with Vaccine Information Sheet and instruction to access the V-Safe system.   Helen Andrade was instructed to call 911 with any severe reactions post vaccine: Marland Kitchen Difficulty breathing  . Swelling of face and throat  . A fast heartbeat  . A bad rash all over body  . Dizziness and weakness   Immunizations Administered    Name Date Dose VIS Date Route   Pfizer COVID-19 Vaccine 12/11/2019 10:55 AM 0.3 mL 08/10/2019 Intramuscular   Manufacturer: [employer], [employer]   Lot: [id number]   NDC: [address]-0

## 2020-03-20 ENCOUNTER — Ambulatory Visit (INDEPENDENT_AMBULATORY_CARE_PROVIDER_SITE_OTHER): Payer: BC Managed Care – PPO | Admitting: Sports Medicine

## 2020-03-20 ENCOUNTER — Other Ambulatory Visit: Payer: Self-pay

## 2020-03-20 ENCOUNTER — Ambulatory Visit
Admission: RE | Admit: 2020-03-20 | Discharge: 2020-03-20 | Disposition: A | Discharge status: Home / Self Care | Payer: BC Managed Care – PPO | Source: Ambulatory Visit | Attending: Sports Medicine | Admitting: Sports Medicine

## 2020-03-20 VITALS — BP 122/80 | Ht 64.0 in | Wt 165.0 lb

## 2020-03-20 DIAGNOSIS — S76119A Strain of unspecified quadriceps muscle, fascia and tendon, initial encounter: Secondary | ICD-10-CM | POA: Diagnosis not present

## 2020-03-20 DIAGNOSIS — M546 Pain in thoracic spine: Secondary | ICD-10-CM

## 2020-03-20 DIAGNOSIS — M25552 Pain in left hip: Secondary | ICD-10-CM

## 2020-03-20 DIAGNOSIS — G8929 Other chronic pain: Secondary | ICD-10-CM | POA: Diagnosis not present

## 2020-03-20 DIAGNOSIS — M1612 Unilateral primary osteoarthritis, left hip: Secondary | ICD-10-CM | POA: Diagnosis not present

## 2020-03-20 NOTE — Patient Instructions (Signed)
Thank you for coming in to see [place] today! Please see below to review our plan for today's visit:  1. Reach out to [facility] [phone] to get established. 2. Please get the Xray of your Left hip as soon as possible. 3. Follow up in 4 weeks before you leave.  Please call the clinic at [phone] if your symptoms worsen or you have any concerns. It was our pleasure to serve you!   Dr. Vertis Kelch Dr. Peggyann Shoals [facility]

## 2020-03-20 NOTE — Progress Notes (Addendum)
Helen Andrade - 59 y.o. female MRN [id number]  Date of birth: 1961-01-05    SUBJECTIVE:    This is a pleasant 59 year old patient returning to clinic (last visit 2015) with a few concerns   Chief Complaint:/ HPI:  Mid back pain: Reports that she has been having her mid back pain for a few years now, aggravated with lifting heavy things.  Reports that she has a leaf blower backpack, she had to purchase a smaller backpack at the old was creating a lot of pain.  She reports that no particular movement immediately sets off the pain, but rather her pain start to slowly build the next day after she has been very active.  Occasionally she feels a "pinching" sensation in her mid back, otherwise describes the pain as an ache/soreness.  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  She purchased an inverter table but reports that this made her pain worse.  She also visited a chiropractor which was kind of helpful.  She did purchase a firmer mattress Topper and reports this has been the most helpful.  As a rule she does not like to take pain medicines, has not tried Aleve/ibuprofen.  Feels better with warm baths/warm compress.  No focal neurological deficits appreciated on exam, no report of loss of bladder or bowel function. Denies any shooting pains going down the back of her legs into her feet.  Left hip pain: Was previously seen for this issue in 2015, diagnosed with osteoarthritis of the left hip.  Reports that she has sharp pain of the medial hip joint with external rotation, reports this is the only distinct and predictable pain with her hip.  Otherwise she reports a dull shooting pain in the front/top of her left leg over the thigh, knee, and shin.  This especially occurs with lying in bed at night, especially with lying on the left hip.  Walking causes the pain to hurt a lot more.  She describes stiffness all over in the morning which improves with movement.  Denies any  injuries or trauma.  She does not follow any particular exercise regimen or plan.  Denies any shooting pains going down the back of her legs into her feet.  Bilateral knee pain: Reports having pain directly above her bilateral kneecaps which is especially worse with more use.  Reports that she climbs many stairs and this seems to make it worse.  Pain has been ongoing for several months and is aggravated with "pushing off/stepping up" on either of her legs.  Reports the pain is worse with walking long distances.  Describes the pain as a dull burning sensation that does not radiate.  Denies any fevers, rashes, joint swelling.   ROS:     See HPI  PERTINENT  PMH / PSH FH / / SH:  Past Medical, Surgical, Social, and Family History Reviewed & Updated in the EMR.  Pertinent findings include:  Patient Active Problem List   Diagnosis Date Noted  . Osteoarthritis of both hands 03/25/2016  . Left hip pain 03/23/2016  . Bilateral hand pain 03/23/2016  . Arthritis of left hip 03/23/2016     OBJECTIVE: BP 122/80   Ht 5\' 4"  (1.626 m)   Wt 165 lb (74.8 kg)   LMP 03/13/2016 (Exact Date)   BMI 28.32 kg/m   Physical Exam:  Vital signs are reviewed.  GEN: Alert and oriented, NAD Pulm: Breathing unlabored PSY: normal mood, congruent affect  MSK: Back: - Inspection: No gross deformity, scoliosis. - Palpation: No midline or bony TTP, some tenderness to palpation of paravertebral musculature - Range of motion: Limited range of motion with forward flexion  Left hip:  - Inspection: No gross deformity, no swelling, erythema, or ecchymosis - Palpation: No TTP, specifically none over greater trochanter - ROM: Normal range of motion on Flexion, extension, abduction, external range of motion full but elicits pain; internal rotation somewhat limited bilaterally - Strength: Decreased abduction strength (4/5), otherwise normal strength throughout (right hip 5/5 strength in all planes) - Neuro/vasc: NV intact  distally - Special Tests: Positive FABER and FADIR.  Positive Trendelenberg.  Knees:  - Inspection: no gross deformity. No swelling/effusion, erythema or bruising. Skin intact - Palpation: no TTP - ROM: full active ROM with flexion and extension in knee and hip - Strength: 5/5 strength - Special Tests: negative anterior and posterior drawer, negative Lachman's, no MCL or LCL laxity; negative Thessaly    ASSESSMENT & PLAN:  1. Thoracic Back Pain: Seems muscular in nature, no red flag symptoms to suggest ominous bony/neurological pathology -No imaging indicated at this time -Patient would benefit from physical therapy -Encourage warm baths with Epson salts, warm compress, conservative management  2. Quadriceps Strain: Pain increases with use, no injury/accident, worse with climbing stairs -Recommend physical therapy for strengthening  3. L. Hip Osteoarthritis: concern for worsening OA with worsening symptoms. Weakness appreciated to Left hip (positive Trendelenburg, 4/5 strength of left hip abduction) -Physical therapy -Repeat left hip x-ray (standing imaging) -Follow-up 4 weeks (patient planning to leave the country for extended trip on September 3)   Helen Shoals, DO [facility], PGY-3 03/20/2020 9:49 AM   Patient seen and evaluated with the resident.  I agree with the above plan of care.  I am going to order a standing x-ray of the left hip to evaluate the degree of osteoarthritis present.  I think she would benefit greatly from formal physical therapy and she will follow up with me again in 4 weeks.  She will be leaving the country on September 3 and will be gone for several months so if we need to consider a repeat cortisone injection into the left we can plan on doing this before her travel.  Addendum: X-rays reviewed.  Once again seen is mild degenerative changes along the superior joint line.  These appear to be unchanged when compared to previous x-rays.   Follow-up with me as scheduled.

## 2020-03-25 DIAGNOSIS — M25552 Pain in left hip: Secondary | ICD-10-CM | POA: Diagnosis not present

## 2020-03-28 DIAGNOSIS — M25552 Pain in left hip: Secondary | ICD-10-CM | POA: Diagnosis not present

## 2020-04-01 DIAGNOSIS — M25552 Pain in left hip: Secondary | ICD-10-CM | POA: Diagnosis not present

## 2020-04-03 DIAGNOSIS — M25552 Pain in left hip: Secondary | ICD-10-CM | POA: Diagnosis not present

## 2020-04-08 DIAGNOSIS — M25552 Pain in left hip: Secondary | ICD-10-CM | POA: Diagnosis not present

## 2020-04-10 DIAGNOSIS — M25552 Pain in left hip: Secondary | ICD-10-CM | POA: Diagnosis not present

## 2020-04-15 DIAGNOSIS — M25552 Pain in left hip: Secondary | ICD-10-CM | POA: Diagnosis not present

## 2020-04-17 ENCOUNTER — Ambulatory Visit: Payer: BC Managed Care – PPO | Admitting: Sports Medicine

## 2020-04-17 DIAGNOSIS — M25552 Pain in left hip: Secondary | ICD-10-CM | POA: Diagnosis not present

## 2020-04-22 ENCOUNTER — Other Ambulatory Visit: Payer: Self-pay

## 2020-04-22 ENCOUNTER — Other Ambulatory Visit: Payer: Self-pay | Admitting: Sports Medicine

## 2020-04-22 ENCOUNTER — Ambulatory Visit: Payer: BC Managed Care – PPO | Admitting: Sports Medicine

## 2020-04-22 VITALS — BP 100/68 | Ht 64.0 in

## 2020-04-22 DIAGNOSIS — M25552 Pain in left hip: Secondary | ICD-10-CM

## 2020-04-22 NOTE — Addendum Note (Signed)
Addended by: Annita Brod on: 04/22/2020 11:22 AM   Modules accepted: Orders

## 2020-04-22 NOTE — Progress Notes (Signed)
° °  Subjective:    Patient ID: Helen Andrade, female    DOB: 1961-01-20, 59 y.o.   MRN: [id number]  HPI   Patient comes in today for follow-up.  Overall, she has noticed improvement with both her left hip mobility and her back pain with physical therapy.  However, she still experiences intermittent pain in the groin which concerns her and makes her cautious in her activities.  Her pain is identical in nature to what she experienced in 2015.  At that time she had an intra-articular cortisone injection at [facility] imaging which provided her with several years of relief.  She is leaving the country on September 3 and will be gone for several weeks.  She would like to consider a repeat injection prior to leaving.    Review of Systems As above    Objective:   Physical Exam  Well-developed, well-nourished.  No acute distress  Left hip: Patient does have slightly decreased internal rotation with reproducible pain.  Full external rotation.  Neurovascular intact distally.  Recent x-rays of the left hip shows mild degenerative changes, similar to what was seen in 2015.       Assessment & Plan:   Persistent left hip pain likely secondary to DJD versus degenerative labral tear  I will refer the patient back over to [facility] imaging for a repeat left hip intra-articular cortisone injection.  Hopefully she will get a great response the way she did in 2015.  We did briefly talk about the possibility of PRP versus further diagnostic imaging if her pain persists after the injection.  As stated above, she will be leaving next week and will be out of the country for a few weeks.  She has a good understanding of her home exercise program so I think she can discontinue formal physical therapy and follow-up with me for ongoing or recalcitrant issues.

## 2020-04-28 ENCOUNTER — Ambulatory Visit
Admission: RE | Admit: 2020-04-28 | Discharge: 2020-04-28 | Disposition: A | Discharge status: Home / Self Care | Payer: BC Managed Care – PPO | Source: Ambulatory Visit | Attending: Sports Medicine | Admitting: Sports Medicine

## 2020-04-28 ENCOUNTER — Other Ambulatory Visit: Payer: BC Managed Care – PPO

## 2020-04-28 ENCOUNTER — Other Ambulatory Visit: Payer: Self-pay

## 2020-04-28 DIAGNOSIS — M25552 Pain in left hip: Secondary | ICD-10-CM | POA: Diagnosis not present

## 2020-04-28 MED ORDER — IOPAMIDOL (ISOVUE-M 200) INJECTION 41%
1.0000 mL | Freq: Once | INTRAMUSCULAR | Status: AC
  Administered 2020-04-28: 1 mL via INTRA_ARTICULAR

## 2020-04-28 MED ORDER — METHYLPREDNISOLONE ACETATE 40 MG/ML INJ SUSP (RADIOLOG
120.0000 mg | Freq: Once | INTRAMUSCULAR | Status: AC
  Administered 2020-04-28: 120 mg via INTRA_ARTICULAR

## 2020-10-30 DIAGNOSIS — Z23 Encounter for immunization: Secondary | ICD-10-CM | POA: Diagnosis not present

## 2021-04-26 IMAGING — XA DG FLUORO GUIDE NDL PLC/BX
1 series · 1 of 1 positions shown · IV contrast (isovue)
Comparison: None.

CLINICAL DATA: Hip pain.  Question labral tear.

EXAM:
Fluoro guided left hip injection
TECHNIQUE: The overlying skin was prepped with Betadine, draped in the usual
sterile fashion, and infiltrated locally with 1% Lidocaine. A 22
gauge spinal needle was advanced under fluoroscopic observation to
the lateral femoral neck. Confirmatory injection of less than 1 ml
of Isovue 200 demonstrates intra-articular spread without
intravascular component.
120 mg Depo-Medrol and 3 ml 1% lidocaine were then instilled. The
procedure was well tolerated. The patient was observed for a short
period of time then discharged in good condition.
FLUOROSCOPY TIME:  0 minutes 41 seconds. 9.37 micro gray meter
squared

[Series 1: ortho standard · 1 of 1 slices shown]
[im 1/1]
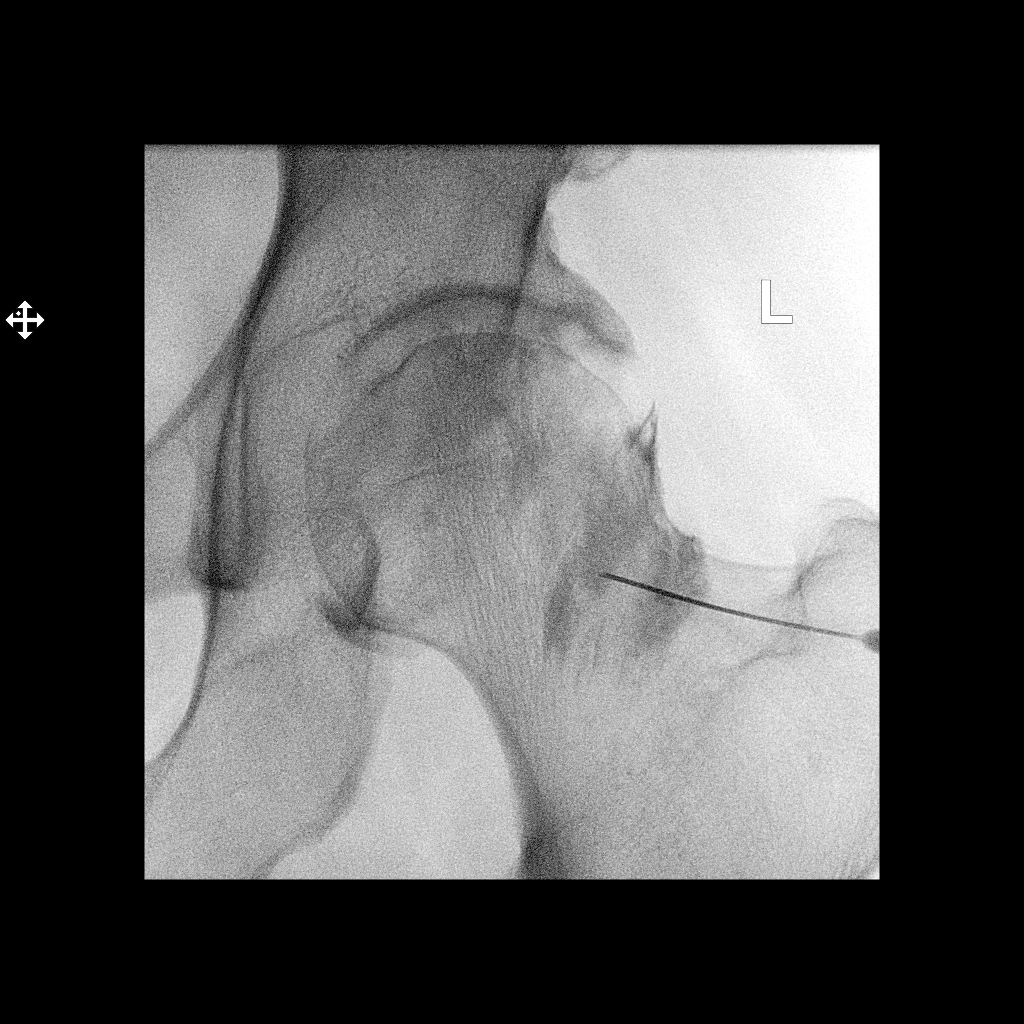

[1 of 1 positions shown; findings below may reference images not displayed]

IMPRESSION: Technically successful left hip injection under fluoroscopy.

## 2021-05-26 ENCOUNTER — Ambulatory Visit: Payer: BC Managed Care – PPO | Admitting: Sports Medicine

## 2021-05-26 VITALS — Ht 64.0 in

## 2021-05-26 DIAGNOSIS — M25552 Pain in left hip: Secondary | ICD-10-CM | POA: Diagnosis not present

## 2021-05-26 NOTE — Progress Notes (Signed)
   Subjective:    Patient ID: Helen Andrade, female    DOB: 02-23-1961, 60 y.o.   MRN: [id number]  HPI chief complaint: Left hip pain  Helen Andrade presents today with returning left hip pain.  She was last seen in the office in August 2021 with a similar complaint.  Prior to that, she had had pain in 2015.  She has had 2 intra-articular cortisone injections during that time.  Her most recent injection in August 2021 only lasted about 4 months.  She still endorses pain in the groin which is worse with certain movements.  She notices decreased mobility and instability.  Also difficulty sleeping on his hip at night.  Previous x-rays have shown only mild osteoarthritis.  She is wondering whether or not there may be a labral tear present.  Past medical history reviewed Medications reviewed Allergies reviewed    Review of Systems As above    Objective:   Physical Exam  Well-developed, well-nourished.  No acute distress  Left hip: She has limited internal rotation on the left compared to the right and this does reproduce groin pain.  Positive FADIR.  No tenderness to palpation.  Positive Trendelenburg.  Neurovascularly intact distally.      Assessment & Plan:   Left hip pain secondary to DJD versus labral tear  We are going to reinstitute a home exercise program consisting of hip abductor strengthening and pelvic stabilizer exercises.  This has helped in the past.  She will be out of the country for the next few months but I have asked her to email me in 6 to 8 weeks with a check on her progress.  If her pain persists despite another round of conservative treatment, then I would recommend an MRI of her hip specifically to rule out a labral tear.  She is in agreement with that plan.

## 2021-06-03 ENCOUNTER — Other Ambulatory Visit (HOSPITAL_BASED_OUTPATIENT_CLINIC_OR_DEPARTMENT_OTHER): Payer: Self-pay | Admitting: Family Medicine

## 2021-06-03 DIAGNOSIS — Z1231 Encounter for screening mammogram for malignant neoplasm of breast: Secondary | ICD-10-CM

## 2021-06-04 ENCOUNTER — Other Ambulatory Visit: Payer: Self-pay

## 2021-06-04 ENCOUNTER — Ambulatory Visit (INDEPENDENT_AMBULATORY_CARE_PROVIDER_SITE_OTHER): Payer: BC Managed Care – PPO

## 2021-06-04 DIAGNOSIS — Z1231 Encounter for screening mammogram for malignant neoplasm of breast: Secondary | ICD-10-CM | POA: Diagnosis not present

## 2021-12-30 DIAGNOSIS — M25552 Pain in left hip: Secondary | ICD-10-CM | POA: Diagnosis not present

## 2021-12-30 DIAGNOSIS — L400 Psoriasis vulgaris: Secondary | ICD-10-CM | POA: Diagnosis not present

## 2021-12-30 DIAGNOSIS — M79643 Pain in unspecified hand: Secondary | ICD-10-CM | POA: Diagnosis not present

## 2022-04-20 ENCOUNTER — Ambulatory Visit: Payer: BC Managed Care – PPO | Admitting: Sports Medicine

## 2022-04-20 ENCOUNTER — Ambulatory Visit
Admission: RE | Admit: 2022-04-20 | Discharge: 2022-04-20 | Disposition: A | Discharge status: Home / Self Care | Payer: BC Managed Care – PPO | Source: Ambulatory Visit | Attending: Sports Medicine | Admitting: Sports Medicine

## 2022-04-20 VITALS — BP 120/49 | Ht 64.0 in | Wt 156.0 lb

## 2022-04-20 DIAGNOSIS — Z111 Encounter for screening for respiratory tuberculosis: Secondary | ICD-10-CM | POA: Diagnosis not present

## 2022-04-20 DIAGNOSIS — L4059 Other psoriatic arthropathy: Secondary | ICD-10-CM | POA: Diagnosis not present

## 2022-04-20 DIAGNOSIS — L400 Psoriasis vulgaris: Secondary | ICD-10-CM | POA: Diagnosis not present

## 2022-04-20 DIAGNOSIS — M1612 Unilateral primary osteoarthritis, left hip: Secondary | ICD-10-CM | POA: Diagnosis not present

## 2022-04-20 DIAGNOSIS — M25552 Pain in left hip: Secondary | ICD-10-CM | POA: Diagnosis not present

## 2022-04-20 DIAGNOSIS — R5383 Other fatigue: Secondary | ICD-10-CM | POA: Diagnosis not present

## 2022-04-20 DIAGNOSIS — M7989 Other specified soft tissue disorders: Secondary | ICD-10-CM | POA: Diagnosis not present

## 2022-04-20 NOTE — Patient Instructions (Signed)
[facility] Address: [address] [place], [place] [address] Phone: [phone]

## 2022-04-21 ENCOUNTER — Encounter: Payer: Self-pay | Admitting: Sports Medicine

## 2022-04-21 ENCOUNTER — Encounter: Payer: Self-pay | Admitting: *Deleted

## 2022-04-21 NOTE — Progress Notes (Signed)
   Subjective:    Patient ID: Helen Andrade, female    DOB: 08/28/1961, 61 y.o.   MRN: [id number]  HPI chief complaint: Left hip pain  Helen Andrade presents today with persistent left hip pain.  She has a history of mild to moderate DJD in this hip.  She has received a total of 3 intra-articular cortisone injections in the past.  First injection was helpful but the most recent injection really did not provide much symptom relief.  Her pain is diffuse around the hip.  She is asking whether or not an MRI would be appropriate.  She is currently being evaluated by rheumatology (Dr. Dierdre Forth) for possible psoriatic arthritis.  She is also having some pain and swelling in other parts of her body like the middle finger on her left hand.  Review of rheumatology's notes looks as though they are fairly suspicious of psoriatic arthritis and blood work is currently pending to confirm this diagnosis.  They are waiting to start medication until blood work has returned.    Review of Systems As above    Objective:   Physical Exam  Developed, well nourished.  No acute distress  Left hip: There is some limited internal rotation of the left hip passively compared to the right and this does reproduce groin pain.  No tenderness to palpation.  Neurovascular intact distally.  X-rays of the left hip including an AP and lateral view are obtained and compared to x-rays from July 2021.  There is still some mild to moderate degenerative changes seen within the hip but overall there does not appear to be any significant changes over the past 2 years.      Assessment & Plan:   Chronic left hip pain secondary to DJD versus labral tear versus psoriatic arthritis  Although the patient does have mild to moderate arthritic change on her x-ray I do think it is reasonable to proceed with an MRI to evaluate further.  She may have more advanced arthritis than what is seen on x-ray.  She may also have changes consistent with psoriatic  arthritis or possible labral pathology.  Phone follow-up with those results when available.  We will discuss further work-up and treatment based on those results.  In the meantime, she will continue to follow-up with rheumatology as scheduled.  This note was dictated using Dragon naturally speaking software and may contain errors in syntax, spelling, or content which have not been identified prior to signing this note.

## 2022-04-21 NOTE — Addendum Note (Signed)
Addended by: Annita Brod on: 04/21/2022 11:10 AM   Modules accepted: Orders

## 2022-04-25 DIAGNOSIS — M25552 Pain in left hip: Secondary | ICD-10-CM | POA: Diagnosis not present

## 2022-04-26 ENCOUNTER — Other Ambulatory Visit: Payer: Self-pay | Admitting: Family Medicine

## 2022-04-26 DIAGNOSIS — Z1231 Encounter for screening mammogram for malignant neoplasm of breast: Secondary | ICD-10-CM

## 2022-04-28 ENCOUNTER — Telehealth: Payer: Self-pay | Admitting: Sports Medicine

## 2022-04-28 DIAGNOSIS — M1612 Unilateral primary osteoarthritis, left hip: Secondary | ICD-10-CM

## 2022-04-28 NOTE — Telephone Encounter (Signed)
  I spoke with Helen Andrade on the phone today after reviewing the MRI of her left hip.  MRI was done at [facility].  It shows mild to moderate left hip OA with a degenerated and torn labrum.  She has had positive yet limited responses to intra-articular cortisone injections in the past.  I recommended consultation with Dr. Magnus Ivan to discuss possible surgical options.  She is in agreement with that plan.  She is leaving later today for a trip to [place] so she will contact me upon her return to the [place] we will make that referral for her at that time.

## 2022-04-29 DIAGNOSIS — Z1231 Encounter for screening mammogram for malignant neoplasm of breast: Secondary | ICD-10-CM

## 2022-05-10 ENCOUNTER — Encounter: Payer: Self-pay | Admitting: *Deleted

## 2022-05-10 DIAGNOSIS — L4059 Other psoriatic arthropathy: Secondary | ICD-10-CM | POA: Diagnosis not present

## 2022-05-10 DIAGNOSIS — L400 Psoriasis vulgaris: Secondary | ICD-10-CM | POA: Diagnosis not present

## 2022-05-10 DIAGNOSIS — M25552 Pain in left hip: Secondary | ICD-10-CM | POA: Diagnosis not present

## 2022-05-10 NOTE — Addendum Note (Signed)
Addended by: Annita Brod on: 05/10/2022 02:43 PM   Modules accepted: Orders

## 2022-05-26 ENCOUNTER — Ambulatory Visit: Payer: BC Managed Care – PPO | Admitting: Orthopaedic Surgery

## 2022-05-26 ENCOUNTER — Encounter: Payer: Self-pay | Admitting: Orthopaedic Surgery

## 2022-05-26 DIAGNOSIS — M1612 Unilateral primary osteoarthritis, left hip: Secondary | ICD-10-CM

## 2022-05-26 DIAGNOSIS — M25552 Pain in left hip: Secondary | ICD-10-CM | POA: Diagnosis not present
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
## 2022-11-06 DIAGNOSIS — R6884 Jaw pain: Secondary | ICD-10-CM | POA: Insufficient documentation

## 2022-11-08 DIAGNOSIS — M25552 Pain in left hip: Secondary | ICD-10-CM | POA: Diagnosis not present

## 2022-11-08 DIAGNOSIS — L400 Psoriasis vulgaris: Secondary | ICD-10-CM | POA: Diagnosis not present

## 2022-11-08 DIAGNOSIS — L4059 Other psoriatic arthropathy: Secondary | ICD-10-CM | POA: Diagnosis not present

## 2022-12-10 ENCOUNTER — Other Ambulatory Visit: Payer: Self-pay | Admitting: Family Medicine

## 2022-12-10 DIAGNOSIS — Z1231 Encounter for screening mammogram for malignant neoplasm of breast: Secondary | ICD-10-CM

## 2022-12-15 ENCOUNTER — Ambulatory Visit (INDEPENDENT_AMBULATORY_CARE_PROVIDER_SITE_OTHER): Payer: BC Managed Care – PPO

## 2022-12-15 DIAGNOSIS — Z1231 Encounter for screening mammogram for malignant neoplasm of breast: Secondary | ICD-10-CM | POA: Diagnosis not present

## 2023-02-23 ENCOUNTER — Ambulatory Visit: Payer: BC Managed Care – PPO | Admitting: Family Medicine

## 2023-02-23 ENCOUNTER — Encounter: Payer: Self-pay | Admitting: Family Medicine

## 2023-02-23 VITALS — BP 121/79 | HR 96 | Temp 98.3°F | Wt 166.8 lb

## 2023-02-23 DIAGNOSIS — R0981 Nasal congestion: Secondary | ICD-10-CM | POA: Diagnosis not present

## 2023-02-23 DIAGNOSIS — L400 Psoriasis vulgaris: Secondary | ICD-10-CM | POA: Insufficient documentation

## 2023-02-23 DIAGNOSIS — L4059 Other psoriatic arthropathy: Secondary | ICD-10-CM | POA: Insufficient documentation

## 2023-02-23 DIAGNOSIS — B9689 Other specified bacterial agents as the cause of diseases classified elsewhere: Secondary | ICD-10-CM | POA: Diagnosis not present

## 2023-02-23 LAB — POC COVID19 BINAXNOW: SARS Coronavirus 2 Ag: NEGATIVE

## 2023-02-23 MED ORDER — FLUTICASONE PROPIONATE 50 MCG/ACT NA SUSP: 1.0000 | Freq: Every day | NASAL | 2 refills | Status: DC

## 2023-02-23 MED ORDER — DOXYCYCLINE HYCLATE 100 MG PO TABS: 100.0000 mg | ORAL_TABLET | Freq: Two times a day (BID) | ORAL | 0 refills | Status: DC

## 2023-02-23 NOTE — Progress Notes (Signed)
Helen Andrade , Apr 04, 1961, 62 y.o., female MRN: [id number] Patient Care Team    Relationship Specialty Notifications Start End  Natalia Leatherwood, DO PCP - General Family Medicine  03/23/16     Chief Complaint  Patient presents with   Nasal Congestion    Since March; last 2 days post nasal drip has been worse     Subjective: Helen Andrade is a 62 y.o. Pt presents for an OV with complaints of sinus pain that has been worsening over the last 2 days, however has had sinus discomfort and pressure since March.  She initially thought she had a dental infection and has seen a dental list twice, neither which she had a dental infection.  Patient reports symptoms of postnasal drip that has now started, mild sore throat and feeling fatigued.  She has been taking DayQuil to help with her symptoms.       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 1 daughter Helen Andrade (adult).    Post Grad. Interdisciplinary Artist.    No tobacco, drugs. Social alcohol.    Drinks caffeine.    Wears seatbelt, bicycle helmet and smoke detector in the home.    Feels safe in her relationships.    Past Medical History:  Diagnosis Date   Arthritis    Past Surgical History:  Procedure Laterality Date   CHOLECYSTECTOMY     tooth implant     WISDOM TOOTH EXTRACTION     Family History  Problem Relation Age of Onset   Hearing loss Mother    Early death Father    Arthritis Father    Hearing loss Maternal Aunt    Allergies as of 02/23/2023   No Known Allergies      Medication List        Accurate as of February 23, 2023  1:11 PM. If you have any questions, ask your nurse or doctor.          STOP taking these medications    meclizine 25 MG  tablet Commonly known as: ANTIVERT Stopped by: Felix Pacini, DO   predniSONE 20 MG tablet Commonly known as: DELTASONE Stopped by: Felix Pacini, DO       TAKE these medications    doxycycline 100 MG tablet Commonly known as: VIBRA-TABS Take 1 tablet (100 mg total) by mouth 2 (two) times daily. Started by: Felix Pacini, DO   fluticasone 50 MCG/ACT nasal spray Commonly known as: FLONASE Place 1 spray into both nostrils daily.        All past medical history, surgical history, allergies, family history, immunizations andmedications were updated in the EMR today and reviewed under the history and medication portions of their EMR.     ROS Negative, with the exception of above mentioned in HPI   Objective:  BP 121/79   Pulse 96   Temp 98.3 F (36.8 C)   Wt 166 lb 12.8 oz (75.7 kg)   LMP 03/13/2016 (Exact Date)   SpO2 97%   BMI 28.63 kg/m  Body mass index is 28.63 kg/m. Physical Exam Vitals and nursing note reviewed.  Constitutional:      General:  She is not in acute distress.    Appearance: Normal appearance. She is normal weight. She is not ill-appearing or toxic-appearing.  HENT:     Head: Normocephalic and atraumatic.     Comments: Maxillary sinus TTP    Right Ear: Tympanic membrane and ear canal normal. There is no impacted cerumen.     Left Ear: Tympanic membrane and ear canal normal. There is no impacted cerumen.     Nose: Congestion and rhinorrhea present.     Mouth/Throat:     Mouth: Mucous membranes are moist.     Pharynx: Posterior oropharyngeal erythema present. No oropharyngeal exudate.  Eyes:     General: No scleral icterus.       Right eye: No discharge.        Left eye: No discharge.     Extraocular Movements: Extraocular movements intact.     Conjunctiva/sclera: Conjunctivae normal.     Pupils: Pupils are equal, round, and reactive to light.  Skin:    Findings: No rash.  Neurological:     Mental Status: She is alert and oriented to person,  place, and time. Mental status is at baseline.     Motor: No weakness.     Coordination: Coordination normal.     Gait: Gait normal.  Psychiatric:        Mood and Affect: Mood normal.        Behavior: Behavior normal.        Thought Content: Thought content normal.        Judgment: Judgment normal.      No results found. No results found. Results for orders placed or performed in visit on 02/23/23 (from the past 24 hour(s))  POC COVID-19 BinaxNow     Status: None   Collection Time: 02/23/23 11:50 AM  Result Value Ref Range   SARS Coronavirus 2 Ag Negative Negative    Assessment/Plan: Helen Andrade is a 62 y.o. female present for OV for  Nasal congestion - POC COVID-19 BinaxNow>negative  Bacterial sinusitis Rest, hydrate.  + flonase, mucinex (DM if cough), nettie pot or nasal saline.  Doxy bid prescribed, take until completed.  If cough present it can last up to 6-8 weeks.  F/U 2 weeks of not improved.   Reviewed expectations re: course of current medical issues. Discussed self-management of symptoms. Outlined signs and symptoms indicating need for more acute intervention. Patient verbalized understanding and all questions were answered. Patient received an After-Visit Summary.    Orders Placed This Encounter  Procedures   POC COVID-19 BinaxNow   Meds ordered this encounter  Medications   doxycycline (VIBRA-TABS) 100 MG tablet    Sig: Take 1 tablet (100 mg total) by mouth 2 (two) times daily.    Dispense:  20 tablet    Refill:  0   fluticasone (FLONASE) 50 MCG/ACT nasal spray    Sig: Place 1 spray into both nostrils daily.    Dispense:  16 g    Refill:  2   Referral Orders  No referral(s) requested today     Note is dictated utilizing voice recognition software. Although note has been proof read prior to signing, occasional typographical errors still can be missed. If any questions arise, please do not hesitate to call for verification.   electronically  signed by:  Felix Pacini, DO  [facility] [place]

## 2023-02-23 NOTE — Patient Instructions (Addendum)
Antibiotic every 12 hrs, 10 days Flonase nasal spray daily   Return for cpe (20 min).        Great to see you today.  I have refilled the medication(s) we provide.   If labs were collected, we will inform you of lab results once received either by echart message or telephone call.   - echart message- for normal results that have been seen by the patient already.   - telephone call: abnormal results or if patient has not viewed results in their echart.

## 2023-03-07 ENCOUNTER — Ambulatory Visit (INDEPENDENT_AMBULATORY_CARE_PROVIDER_SITE_OTHER): Payer: BC Managed Care – PPO | Admitting: Family Medicine

## 2023-03-07 ENCOUNTER — Encounter: Payer: Self-pay | Admitting: Family Medicine

## 2023-03-07 VITALS — BP 121/84 | HR 78 | Temp 97.9°F | Ht 65.0 in | Wt 167.0 lb

## 2023-03-07 DIAGNOSIS — Z13220 Encounter for screening for lipoid disorders: Secondary | ICD-10-CM

## 2023-03-07 DIAGNOSIS — Z1231 Encounter for screening mammogram for malignant neoplasm of breast: Secondary | ICD-10-CM

## 2023-03-07 DIAGNOSIS — Z0000 Encounter for general adult medical examination without abnormal findings: Secondary | ICD-10-CM | POA: Diagnosis not present

## 2023-03-07 DIAGNOSIS — Z1159 Encounter for screening for other viral diseases: Secondary | ICD-10-CM | POA: Diagnosis not present

## 2023-03-07 DIAGNOSIS — Z131 Encounter for screening for diabetes mellitus: Secondary | ICD-10-CM | POA: Diagnosis not present

## 2023-03-07 DIAGNOSIS — Z114 Encounter for screening for human immunodeficiency virus [HIV]: Secondary | ICD-10-CM

## 2023-03-07 DIAGNOSIS — Z23 Encounter for immunization: Secondary | ICD-10-CM | POA: Diagnosis not present

## 2023-03-07 LAB — CBC WITH DIFFERENTIAL/PLATELET
Basophils Absolute: 0.1 10*3/uL (ref 0.0–0.1)
Basophils Relative: 0.8 % (ref 0.0–3.0)
Eosinophils Absolute: 0.1 10*3/uL (ref 0.0–0.7)
Eosinophils Relative: 1.5 % (ref 0.0–5.0)
HCT: 43.5 % (ref 36.0–46.0)
Hemoglobin: 14.3 g/dL (ref 12.0–15.0)
Lymphocytes Relative: 24.3 % (ref 12.0–46.0)
Lymphs Abs: 2 10*3/uL (ref 0.7–4.0)
MCHC: 32.8 g/dL (ref 30.0–36.0)
MCV: 93.4 fl (ref 78.0–100.0)
Monocytes Absolute: 0.5 10*3/uL (ref 0.1–1.0)
Monocytes Relative: 5.6 % (ref 3.0–12.0)
Neutro Abs: 5.5 10*3/uL (ref 1.4–7.7)
Neutrophils Relative %: 67.8 % (ref 43.0–77.0)
Platelets: 260 10*3/uL (ref 150.0–400.0)
RBC: 4.65 Mil/uL (ref 3.87–5.11)
RDW: 13.1 % (ref 11.5–15.5)
WBC: 8.1 10*3/uL (ref 4.0–10.5)

## 2023-03-07 LAB — COMPREHENSIVE METABOLIC PANEL
ALT: 13 U/L (ref 0–35)
AST: 12 U/L (ref 0–37)
Albumin: 4.3 g/dL (ref 3.5–5.2)
Alkaline Phosphatase: 64 U/L (ref 39–117)
BUN: 12 mg/dL (ref 6–23)
CO2: 27 mEq/L (ref 19–32)
Calcium: 9.6 mg/dL (ref 8.4–10.5)
Chloride: 104 mEq/L (ref 96–112)
Creatinine, Ser: 0.76 mg/dL (ref 0.40–1.20)
GFR: 85.2 mL/min (ref >60.00)
Glucose, Bld: 96 mg/dL (ref 70–99)
Potassium: 4.1 mEq/L (ref 3.5–5.1)
Sodium: 140 mEq/L (ref 135–145)
Total Bilirubin: 0.6 mg/dL (ref 0.2–1.2)
Total Protein: 7.3 g/dL (ref 6.0–8.3)

## 2023-03-07 LAB — HEMOGLOBIN A1C: Hgb A1c MFr Bld: 5.6 % (ref 4.6–6.5)

## 2023-03-07 LAB — LIPID PANEL
Cholesterol: 228 mg/dL — ABNORMAL HIGH (ref 0–200)
HDL: 58.7 mg/dL (ref >39.00)
LDL Cholesterol: 148 mg/dL — ABNORMAL HIGH (ref 0–99)
NonHDL: 168.88
Total CHOL/HDL Ratio: 4
Triglycerides: 104 mg/dL (ref 0.0–149.0)
VLDL: 20.8 mg/dL (ref 0.0–40.0)

## 2023-03-07 LAB — TSH: TSH: 1.27 u[IU]/mL (ref 0.35–5.50)

## 2023-03-07 NOTE — Progress Notes (Signed)
Patient ID: Helen Andrade, female  DOB: 1960-11-06, 62 y.o.   MRN: [id number] Patient Care Team    Relationship Specialty Notifications Start End  Natalia Leatherwood, DO PCP - General Family Medicine  03/23/16     Chief Complaint  Patient presents with   Annual Exam    Pt is fasting; 07/18 will be having root canal    Subjective:  Helen Andrade is a 62 y.o. female present for CPE. All past medical history, surgical history, allergies, family history, immunizations, medications and social history were updated in the electronic medical record today. All recent labs, ED visits and hospitalizations within the last year were reviewed.  Health maintenance:  Colon cancer screen: last screen 11/2017, recommend follow up 5; resulted x1 polyp. Completed by Dr. Loreta Ave Breast cancer screen: 12/15/2022. [place]. Ordered for 2025.  Immunizations:  tdap updated today, influenza -encouraged yearly, shingrix had completed Infectious disease screening: HIV and Hep C screenings completed today per pt desire Patient has a Dental home. Hospitalizations/ED visits: reviewed     03/07/2023    1:46 PM 02/23/2023   11:37 AM 03/27/2018    3:37 PM 05/15/2014    1:32 PM  Depression screen PHQ 2/9  Decreased Interest 0 0 0 0  Down, Depressed, Hopeless 0 0 0 0  PHQ - 2 Score 0 0 0 0       No data to display                03/07/2023    1:45 PM 02/23/2023   11:37 AM 05/15/2014    1:32 PM  Fall Risk   Falls in the past year? 0 0 No  Number falls in past yr: 0 0   Injury with Fall? 0 0   Risk for fall due to : No Fall Risks  Other (Comment)  Follow up Follow up appointment Falls evaluation completed    Immunization History  Administered Date(s) Administered   Influenza, Quadrivalent, Recombinant, Inj, Pf 07/26/2018   Influenza,inj,Quad PF,6+ Mos 07/05/2017   Influenza-Unspecified 07/05/2017   PFIZER(Purple Top)SARS-COV-2 Vaccination 11/15/2019, 12/11/2019, 10/30/2020   Tdap 03/07/2023    Past  Medical History:  Diagnosis Date   Arthritis    No Known Allergies Past Surgical History:  Procedure Laterality Date   CHOLECYSTECTOMY     tooth implant     WISDOM TOOTH EXTRACTION     Family History  Problem Relation Age of Onset   Hearing loss Mother    Early death Father    Arthritis Father    Hearing loss Maternal Aunt    Social History   Social History Narrative   Married, 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    Post Grad. Interdisciplinary Artist.    No tobacco, drugs. Social alcohol.    Drinks caffeine.    Wears seatbelt, bicycle helmet and smoke detector in the home.    Feels safe in her relationships.     Allergies as of 03/07/2023   No Known Allergies      Medication List        Accurate as of March 07, 2023  1:54 PM. If you have any questions, ask your nurse or doctor.          STOP taking these medications    doxycycline 100 MG tablet Commonly known as: VIBRA-TABS Stopped by: Felix Pacini, DO       TAKE these medications    fluticasone 50 MCG/ACT nasal spray Commonly known  as: FLONASE Place 1 spray into both nostrils daily.       All past medical history, surgical history, allergies, family history, immunizations andmedications were updated in the EMR today and reviewed under the history and medication portions of their EMR.     Recent Results (from the past 2160 hour(s))  POC COVID-19 BinaxNow     Status: None   Collection Time: 02/23/23 11:50 AM  Result Value Ref Range   SARS Coronavirus 2 Ag Negative Negative    ROS 14 pt review of systems performed and negative (unless mentioned in an HPI)  Objective: BP 121/84   Pulse 78   Temp 97.9 F (36.6 C)   Ht 5\' 5"  (1.651 m)   Wt 167 lb (75.8 kg)   LMP 03/13/2016 (Exact Date)   SpO2 98%   BMI 27.79 kg/m  Physical Exam Vitals and nursing note reviewed.  Constitutional:      General: She is not in acute distress.    Appearance: Normal appearance. She  is not ill-appearing or toxic-appearing.  HENT:     Head: Normocephalic and atraumatic.     Right Ear: Tympanic membrane, ear canal and external ear normal. There is no impacted cerumen.     Left Ear: Tympanic membrane, ear canal and external ear normal. There is no impacted cerumen.     Nose: No congestion or rhinorrhea.     Mouth/Throat:     Mouth: Mucous membranes are moist.     Pharynx: Oropharynx is clear. No oropharyngeal exudate or posterior oropharyngeal erythema.  Eyes:     General: No scleral icterus.       Right eye: No discharge.        Left eye: No discharge.     Extraocular Movements: Extraocular movements intact.     Conjunctiva/sclera: Conjunctivae normal.     Pupils: Pupils are equal, round, and reactive to light.  Cardiovascular:     Rate and Rhythm: Normal rate and regular rhythm.     Pulses: Normal pulses.     Heart sounds: Normal heart sounds. No murmur heard.    No friction rub. No gallop.  Pulmonary:     Effort: Pulmonary effort is normal. No respiratory distress.     Breath sounds: Normal breath sounds. No stridor. No wheezing, rhonchi or rales.  Chest:     Chest wall: No tenderness.  Abdominal:     General: Abdomen is flat. Bowel sounds are normal. There is no distension.     Palpations: Abdomen is soft. There is no mass.     Tenderness: There is no abdominal tenderness. There is no right CVA tenderness, left CVA tenderness, guarding or rebound.     Hernia: No hernia is present.  Musculoskeletal:        General: No swelling, tenderness or deformity. Normal range of motion.     Cervical back: Normal range of motion and neck supple. No rigidity or tenderness.     Right lower leg: No edema.     Left lower leg: No edema.  Lymphadenopathy:     Cervical: No cervical adenopathy.  Skin:    General: Skin is warm and dry.     Coloration: Skin is not jaundiced or pale.     Findings: No bruising, erythema, lesion or rash.  Neurological:     General: No focal  deficit present.     Mental Status: She is alert and oriented to person, place, and time. Mental status is at baseline.  Cranial Nerves: No cranial nerve deficit.     Sensory: No sensory deficit.     Motor: No weakness.     Coordination: Coordination normal.     Gait: Gait normal.     Deep Tendon Reflexes: Reflexes normal.  Psychiatric:        Mood and Affect: Mood normal.        Behavior: Behavior normal.        Thought Content: Thought content normal.        Judgment: Judgment normal.     No results found.  Assessment/plan: Helen Andrade is a 62 y.o. female present for CPE Breast cancer screening by mammogram - MM 3D SCREENING MAMMOGRAM BILATERAL BREAST; Future Lipid screening - Lipid panel Diabetes mellitus screening - Hemoglobin A1c Encounter for hepatitis C screening test for low risk patient - Hepatitis C Antibody Encounter for screening for HIV - HIV antibody (with reflex) Need for Tdap vaccination - Tdap vaccine greater than or equal to 7yo IM Routine general medical examination at a health care facility - CBC with Differential/Platelet - Comprehensive metabolic panel - TSH Patient was encouraged to exercise greater than 150 minutes a week. Patient was encouraged to choose a diet filled with fresh fruits and vegetables, and lean meats. AVS provided to patient today for education/recommendation on gender specific health and safety maintenance. Colonoscopy: last screen 11/2017, recommend follow up 5-10; resulted x1 polyp. Completed by Dr. Loreta Ave Breast cancer screen: 12/15/2022. [place]. Ordered for 2025.  Immunizations:  tdap updated today, influenza -encouraged yearly, shingrix had completed Infectious disease screening: HIV and Hep C screenings completed today per pt desire  Return in about 1 year (around 03/07/2024) for cpe (20 min).   Orders Placed This Encounter  Procedures   MM 3D SCREENING MAMMOGRAM BILATERAL BREAST   Tdap vaccine greater than or equal to 7yo  IM   CBC with Differential/Platelet   Comprehensive metabolic panel   Hemoglobin A1c   TSH   Lipid panel   HIV antibody (with reflex)   Hepatitis C Antibody   No orders of the defined types were placed in this encounter.  Referral Orders  No referral(s) requested today     Note is dictated utilizing voice recognition software. Although note has been proof read prior to signing, occasional typographical errors still can be missed. If any questions arise, please do not hesitate to call for verification.  Electronically signed by: Felix Pacini, DO [facility] [place]

## 2023-03-07 NOTE — Patient Instructions (Addendum)
Return in about 1 year (around 03/07/2024) for cpe (20 min).        Great to see you today.  I have refilled the medication(s) we provide.   If labs were collected, we will inform you of lab results once received either by echart message or telephone call.   - echart message- for normal results that have been seen by the patient already.   - telephone call: abnormal results or if patient has not viewed results in their echart.  

## 2023-03-08 LAB — HIV ANTIBODY (ROUTINE TESTING W REFLEX): HIV 1&2 Ab, 4th Generation: NONREACTIVE

## 2023-03-08 LAB — HEPATITIS C ANTIBODY: Hepatitis C Ab: NONREACTIVE

## 2023-06-13 ENCOUNTER — Encounter: Payer: Self-pay | Admitting: Family Medicine

## 2023-06-13 ENCOUNTER — Ambulatory Visit: Payer: BC Managed Care – PPO | Admitting: Family Medicine

## 2023-06-13 VITALS — BP 128/88 | HR 97 | Temp 98.2°F | Wt 169.6 lb

## 2023-06-13 DIAGNOSIS — H8111 Benign paroxysmal vertigo, right ear: Secondary | ICD-10-CM

## 2023-06-13 NOTE — Patient Instructions (Signed)

## 2023-06-13 NOTE — Progress Notes (Signed)
Helen Andrade , 1961/01/24, 62 y.o., female MRN: [id number] Patient Care Team    Relationship Specialty Notifications Start End  Natalia Leatherwood, DO PCP - General Family Medicine  03/23/16     Chief Complaint  Patient presents with   Dizziness    States doing better since weekend      Subjective: Helen Andrade is a 62 y.o. Pt presents for an OV with complaints of right sided BPPV.  Has been present since 2019, but she usually is able to perform maneuvers at home to improve her symptoms.  Over the last month her symptoms have worsened and maneuvers at home were not helpful until this weekend.  She feels she was able to get it to calm down.  She has never been to vestibular rehab.  She has been prescribed Flonase, however that made her feel more congested.  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    Post Grad. Interdisciplinary Artist.    No tobacco, drugs. Social alcohol.    Drinks caffeine.    Wears seatbelt, bicycle helmet and smoke detector in the home.    Feels safe in her relationships.    Past Medical History:  Diagnosis Date   Arthritis    Past Surgical History:  Procedure Laterality Date   CHOLECYSTECTOMY     tooth implant     WISDOM TOOTH EXTRACTION     Family History  Problem Relation Age of Onset   Hearing loss Mother    Early death Father    Arthritis Father    Hearing loss Maternal Aunt    Allergies as of 06/13/2023   No Known Allergies      Medication List        Accurate as of June 13, 2023 10:53 AM. If you have any questions, ask your nurse or doctor.          fluticasone 50 MCG/ACT  nasal spray Commonly known as: FLONASE Place 1 spray into both nostrils daily.        All past medical history, surgical history, allergies, family history, immunizations andmedications were updated in the EMR today and reviewed under the history and medication portions of their EMR.     ROS Negative, with the exception of above mentioned in HPI   Objective:  BP 128/88   Pulse 97   Temp 98.2 F (36.8 C)   Wt 169 lb 9.6 oz (76.9 kg)   LMP 03/13/2016 (Exact Date)   SpO2 98%   BMI 28.22 kg/m  Body mass index is 28.22 kg/m. Physical Exam Vitals and nursing note reviewed.  Constitutional:      General: She is not in acute distress.    Appearance: Normal appearance. She is normal weight. She is not ill-appearing or toxic-appearing.  HENT:     Head: Normocephalic and atraumatic.     Comments: Right TM: No erythema, no bulging, small effusion present    Right Ear: Ear canal normal. There is no impacted cerumen.  Left Ear: Tympanic membrane and ear canal normal. There is no impacted cerumen.  Eyes:     General: No scleral icterus.       Right eye: No discharge.        Left eye: No discharge.     Extraocular Movements: Extraocular movements intact.     Conjunctiva/sclera: Conjunctivae normal.     Pupils: Pupils are equal, round, and reactive to light.  Musculoskeletal:     Cervical back: Neck supple.  Skin:    Findings: No rash.  Neurological:     Mental Status: She is alert and oriented to person, place, and time. Mental status is at baseline.     Motor: No weakness.     Coordination: Coordination normal.     Gait: Gait normal.  Psychiatric:        Mood and Affect: Mood normal.        Behavior: Behavior normal.        Thought Content: Thought content normal.        Judgment: Judgment normal.      No results found. No results found. No results found for this or any previous visit (from the past 24 hour(s)).  Assessment/Plan: Teeghan Hammer is a 62 y.o.  female present for OV for  1. Benign paroxysmal positional vertigo of right ear Flonase was not helpful and caused her to feel more congested.  Encouraged her to continue the nasal saline twice daily. We discussed referral to vestibular rehab for her today, and she would like to get established. - Ambulatory referral to Physical Therapy  Reviewed expectations re: course of current medical issues. Discussed self-management of symptoms. Outlined signs and symptoms indicating need for more acute intervention. Patient verbalized understanding and all questions were answered. Patient received an After-Visit Summary.    Orders Placed This Encounter  Procedures   Ambulatory referral to Physical Therapy   No orders of the defined types were placed in this encounter.  Referral Orders         Ambulatory referral to Physical Therapy       Note is dictated utilizing voice recognition software. Although note has been proof read prior to signing, occasional typographical errors still can be missed. If any questions arise, please do not hesitate to call for verification.   electronically signed by:  Felix Pacini, DO  [facility] [place]

## 2023-06-29 DIAGNOSIS — H8111 Benign paroxysmal vertigo, right ear: Secondary | ICD-10-CM | POA: Diagnosis not present

## 2023-07-01 DIAGNOSIS — H8111 Benign paroxysmal vertigo, right ear: Secondary | ICD-10-CM | POA: Diagnosis not present

## 2023-07-14 DIAGNOSIS — H8111 Benign paroxysmal vertigo, right ear: Secondary | ICD-10-CM | POA: Diagnosis not present

## 2023-12-21 ENCOUNTER — Encounter: Payer: Self-pay | Admitting: Family Medicine

## 2023-12-21 ENCOUNTER — Ambulatory Visit (INDEPENDENT_AMBULATORY_CARE_PROVIDER_SITE_OTHER): Admitting: Family Medicine

## 2023-12-21 VITALS — BP 126/74 | HR 89 | Temp 98.2°F | Wt 170.0 lb

## 2023-12-21 DIAGNOSIS — K0889 Other specified disorders of teeth and supporting structures: Secondary | ICD-10-CM | POA: Diagnosis not present

## 2023-12-21 MED ORDER — GABAPENTIN 100 MG PO CAPS: 100.0000 mg | ORAL_CAPSULE | Freq: Two times a day (BID) | ORAL | 2 refills | Status: AC

## 2023-12-21 NOTE — Progress Notes (Signed)
 Helen Andrade , [birth year], 63 y.o., female MRN: [id number] Patient Care Team    Relationship Specialty Notifications Start End  Mariel Shope, DO PCP - General Family Medicine  03/23/16     Chief Complaint  Patient presents with   Dental Pain    Ongoing for months; has been to dentist and was told there was nothing wrong. Pt feels it is more nerve related. Constant, sore to the touch. Pt has tried Tylenol.      Subjective: Helen Andrade is a 63 y.o. Pt presents for an OV with complaints of dental pain of a few months duration.  Patient reports she has seen her dentist and they have done imaging studies and multiple exams and cannot find anything causing her discomfort.  She has had a root canal.  She denies sensitivity to cold or heat, but states that the discomfort is more persistent and constant.  The area is tender to touch and pressure.  She points to the left upper#10 tooth.  She states even if she pushes on the skin above her lip it can cause some mild discomfort with foods.  She states the pain is not that bad and she could live with the discomfort, but it is annoying and tiring. She does not she has had issues with her bite, and this tooth is tilted.  Patient denies TMJ discomfort, but does admit she had some in the past year when she was having difficulty with her bite.  Denies any symptoms or paresthesia left side of face.     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 1 daughter Annette Killings (adult).    Post Grad. Interdisciplinary Artist.    No tobacco, drugs. Social alcohol.    Drinks caffeine.    Wears seatbelt, bicycle helmet and smoke detector in the home.    Feels safe in her relationships.     Past Medical History:  Diagnosis Date   Arthritis    Past Surgical History:  Procedure Laterality Date   CHOLECYSTECTOMY     tooth implant     WISDOM TOOTH EXTRACTION     Family History  Problem Relation Age of Onset   Hearing loss Mother    Early death Father    Arthritis Father    Hearing loss Maternal Aunt    Allergies as of 12/21/2023   No Known Allergies      Medication List        Accurate as of December 21, 2023 12:56 PM. If you have any questions, ask your nurse or doctor.          fluticasone  50 MCG/ACT nasal spray Commonly known as: FLONASE  Place 1 spray into both nostrils daily.   gabapentin  100 MG capsule Commonly known as: Neurontin  Take 1-2 capsules (100-200 mg total) by mouth 2 (two) times daily. Started by: Napolean Backbone        All past medical history, surgical history, allergies, family history, immunizations andmedications were updated in the EMR today and reviewed under the history and medication portions of their EMR.     ROS Negative,  with the exception of above mentioned in HPI   Objective:  BP 126/74   Pulse 89   Temp 98.2 F (36.8 C)   Wt 170 lb (77.1 kg)   LMP 03/13/2016 (Exact Date)   SpO2 98%   BMI 28.29 kg/m  Body mass index is 28.29 kg/m.  Physical Exam Vitals and nursing note reviewed.  Constitutional:      General: She is not in acute distress.    Appearance: Normal appearance. She is normal weight. She is not ill-appearing or toxic-appearing.  HENT:     Head: Normocephalic and atraumatic.     Mouth/Throat:     Comments: Upper left #10 tooth-crowding present.  No discoloration.  Gums without erythema or lesion.  Tender to palpation over the tooth. Eyes:     General: No scleral icterus.       Right eye: No discharge.        Left eye: No discharge.     Extraocular Movements: Extraocular movements intact.     Conjunctiva/sclera: Conjunctivae normal.     Pupils: Pupils are equal, round, and reactive to light.   Skin:    Findings: No rash.  Neurological:     Mental Status: She is alert and oriented to person, place, and time. Mental status is at baseline.     Motor: No weakness.     Coordination: Coordination normal.     Gait: Gait normal.  Psychiatric:        Mood and Affect: Mood normal.        Behavior: Behavior normal.        Thought Content: Thought content normal.        Judgment: Judgment normal.      No results found. No results found. No results found for this or any previous visit (from the past 24 hours).  Assessment/Plan: Helen Andrade is a 63 y.o. female present for OV for  1. Tooth pain (Primary) We discussed many options including nerve irritation. She has tried Sensodyne and like products to help if cause was sensitivity and they were not helpful. Trial of low-dose gabapentin  twice daily discussed and patient agreeable to this approach today. She reports the dental pain has imaged the area extensively without any concerns.  We did discuss consider neurology referral and/for MRI if desiring to move forward with workup.  Patient would like to wait on this for now.  Reviewed expectations re: course of current medical issues. Discussed self-management of symptoms. Outlined signs and symptoms indicating need for more acute intervention. Patient verbalized understanding and all questions were answered. Patient received an After-Visit Summary.    No orders of the defined types were placed in this encounter.  Meds ordered this encounter  Medications   gabapentin  (NEURONTIN ) 100 MG capsule    Sig: Take 1-2 capsules (100-200 mg total) by mouth 2 (two) times daily.    Dispense:  120 capsule    Refill:  2   Referral Orders  No referral(s) requested today     Note is dictated utilizing voice recognition software. Although note has been proof read prior to signing, occasional typographical errors still can be missed. If any questions arise, please do not hesitate to  call for verification.   electronically signed by:  Napolean Backbone, DO  [facility] [place]

## 2023-12-21 NOTE — Patient Instructions (Signed)

## 2023-12-22 DIAGNOSIS — K219 Gastro-esophageal reflux disease without esophagitis: Secondary | ICD-10-CM | POA: Diagnosis not present

## 2023-12-22 DIAGNOSIS — Z860100 Personal history of colon polyps, unspecified: Secondary | ICD-10-CM | POA: Diagnosis not present

## 2023-12-22 DIAGNOSIS — R42 Dizziness and giddiness: Secondary | ICD-10-CM | POA: Diagnosis not present

## 2023-12-22 DIAGNOSIS — Z1211 Encounter for screening for malignant neoplasm of colon: Secondary | ICD-10-CM | POA: Diagnosis not present

## 2024-01-01 ENCOUNTER — Encounter: Payer: Self-pay | Admitting: Family Medicine

## 2024-01-12 ENCOUNTER — Other Ambulatory Visit: Payer: Self-pay | Admitting: Family Medicine

## 2024-01-18 ENCOUNTER — Ambulatory Visit
Admission: RE | Admit: 2024-01-18 | Discharge: 2024-01-18 | Disposition: A | Discharge status: Home / Self Care | Source: Ambulatory Visit | Attending: Family Medicine | Admitting: Family Medicine

## 2024-01-18 DIAGNOSIS — Z1231 Encounter for screening mammogram for malignant neoplasm of breast: Secondary | ICD-10-CM | POA: Diagnosis not present

## 2024-01-24 ENCOUNTER — Ambulatory Visit: Payer: Self-pay | Admitting: Family Medicine

## 2024-02-09 ENCOUNTER — Other Ambulatory Visit: Payer: Self-pay | Admitting: Family Medicine

## 2024-03-08 ENCOUNTER — Encounter: Payer: BC Managed Care – PPO | Admitting: Family Medicine

## 2024-03-12 DIAGNOSIS — Z860100 Personal history of colon polyps, unspecified: Secondary | ICD-10-CM | POA: Diagnosis not present

## 2024-03-12 DIAGNOSIS — Z1211 Encounter for screening for malignant neoplasm of colon: Secondary | ICD-10-CM | POA: Diagnosis not present

## 2024-03-12 DIAGNOSIS — Z860101 Personal history of adenomatous and serrated colon polyps: Secondary | ICD-10-CM | POA: Diagnosis not present

## 2024-03-12 DIAGNOSIS — K635 Polyp of colon: Secondary | ICD-10-CM | POA: Diagnosis not present

## 2024-03-12 DIAGNOSIS — K6389 Other specified diseases of intestine: Secondary | ICD-10-CM | POA: Diagnosis not present

## 2024-03-12 DIAGNOSIS — K5730 Diverticulosis of large intestine without perforation or abscess without bleeding: Secondary | ICD-10-CM | POA: Diagnosis not present

## 2024-03-12 LAB — HM COLONOSCOPY

## 2024-03-29 DIAGNOSIS — K5730 Diverticulosis of large intestine without perforation or abscess without bleeding: Secondary | ICD-10-CM | POA: Insufficient documentation

## 2024-03-29 NOTE — Progress Notes (Signed)
Appointment canceled by provider ?

## 2024-03-29 NOTE — Patient Instructions (Signed)

## 2024-03-30 ENCOUNTER — Ambulatory Visit (INDEPENDENT_AMBULATORY_CARE_PROVIDER_SITE_OTHER): Admitting: Family Medicine

## 2024-03-30 DIAGNOSIS — Z1231 Encounter for screening mammogram for malignant neoplasm of breast: Secondary | ICD-10-CM

## 2024-03-30 DIAGNOSIS — Z131 Encounter for screening for diabetes mellitus: Secondary | ICD-10-CM

## 2024-03-30 DIAGNOSIS — Z13220 Encounter for screening for lipoid disorders: Secondary | ICD-10-CM

## 2024-03-30 DIAGNOSIS — Z0000 Encounter for general adult medical examination without abnormal findings: Secondary | ICD-10-CM

## 2024-05-01 ENCOUNTER — Encounter: Admitting: Family Medicine

## 2024-05-10 DIAGNOSIS — J069 Acute upper respiratory infection, unspecified: Secondary | ICD-10-CM | POA: Diagnosis not present

## 2024-05-10 DIAGNOSIS — J029 Acute pharyngitis, unspecified: Secondary | ICD-10-CM | POA: Diagnosis not present

## 2024-05-10 DIAGNOSIS — Z03818 Encounter for observation for suspected exposure to other biological agents ruled out: Secondary | ICD-10-CM | POA: Diagnosis not present

## 2024-05-10 DIAGNOSIS — R059 Cough, unspecified: Secondary | ICD-10-CM | POA: Diagnosis not present
